# Patient Record
Sex: Female | Born: 1975 | Race: White | Hispanic: No | Marital: Married | Smoking: Never smoker
Health system: Southern US, Community
[De-identification: ages and names within clinical notes are randomized; demographics above are authoritative.]

## PROBLEM LIST (undated history)

## (undated) DIAGNOSIS — E079 Disorder of thyroid, unspecified: Secondary | ICD-10-CM

## (undated) DIAGNOSIS — J45909 Unspecified asthma, uncomplicated: Secondary | ICD-10-CM

## (undated) DIAGNOSIS — K219 Gastro-esophageal reflux disease without esophagitis: Secondary | ICD-10-CM

## (undated) DIAGNOSIS — F319 Bipolar disorder, unspecified: Secondary | ICD-10-CM

## (undated) DIAGNOSIS — G4733 Obstructive sleep apnea (adult) (pediatric): Secondary | ICD-10-CM

## (undated) DIAGNOSIS — K589 Irritable bowel syndrome without diarrhea: Secondary | ICD-10-CM

## (undated) DIAGNOSIS — S134XXA Sprain of ligaments of cervical spine, initial encounter: Secondary | ICD-10-CM

## (undated) HISTORY — PX: CHOLECYSTECTOMY: SHX55

## (undated) HISTORY — PX: TYMPANOSTOMY TUBE PLACEMENT: SHX32

---

## 2014-04-30 ENCOUNTER — Encounter (HOSPITAL_BASED_OUTPATIENT_CLINIC_OR_DEPARTMENT_OTHER): Payer: Self-pay | Admitting: Emergency Medicine

## 2014-04-30 ENCOUNTER — Emergency Department (HOSPITAL_BASED_OUTPATIENT_CLINIC_OR_DEPARTMENT_OTHER)
Admission: EM | Admit: 2014-04-30 | Discharge: 2014-04-30 | Disposition: A | Discharge status: Home / Self Care | Payer: No Typology Code available for payment source | Attending: Emergency Medicine | Admitting: Emergency Medicine

## 2014-04-30 DIAGNOSIS — R002 Palpitations: Secondary | ICD-10-CM

## 2014-04-30 DIAGNOSIS — Z880 Allergy status to penicillin: Secondary | ICD-10-CM | POA: Insufficient documentation

## 2014-04-30 DIAGNOSIS — F411 Generalized anxiety disorder: Secondary | ICD-10-CM | POA: Insufficient documentation

## 2014-04-30 DIAGNOSIS — F419 Anxiety disorder, unspecified: Secondary | ICD-10-CM

## 2014-04-30 DIAGNOSIS — F319 Bipolar disorder, unspecified: Secondary | ICD-10-CM | POA: Insufficient documentation

## 2014-04-30 DIAGNOSIS — R000 Tachycardia, unspecified: Secondary | ICD-10-CM | POA: Insufficient documentation

## 2014-04-30 DIAGNOSIS — K219 Gastro-esophageal reflux disease without esophagitis: Secondary | ICD-10-CM | POA: Insufficient documentation

## 2014-04-30 DIAGNOSIS — Z79899 Other long term (current) drug therapy: Secondary | ICD-10-CM | POA: Insufficient documentation

## 2014-04-30 DIAGNOSIS — E079 Disorder of thyroid, unspecified: Secondary | ICD-10-CM | POA: Insufficient documentation

## 2014-04-30 DIAGNOSIS — R609 Edema, unspecified: Secondary | ICD-10-CM | POA: Insufficient documentation

## 2014-04-30 DIAGNOSIS — E669 Obesity, unspecified: Secondary | ICD-10-CM | POA: Insufficient documentation

## 2014-04-30 DIAGNOSIS — R0789 Other chest pain: Secondary | ICD-10-CM | POA: Insufficient documentation

## 2014-04-30 HISTORY — DX: Gastro-esophageal reflux disease without esophagitis: K21.9

## 2014-04-30 HISTORY — DX: Bipolar disorder, unspecified: F31.9

## 2014-04-30 HISTORY — DX: Disorder of thyroid, unspecified: E07.9

## 2014-04-30 LAB — COMPREHENSIVE METABOLIC PANEL
ALT: 36 U/L — ABNORMAL HIGH (ref 0–35)
AST: 31 U/L (ref 0–37)
Albumin: 3.6 g/dL (ref 3.5–5.2)
Alkaline Phosphatase: 82 U/L (ref 39–117)
BILIRUBIN TOTAL: 0.2 mg/dL — AB (ref 0.3–1.2)
BUN: 8 mg/dL (ref 6–23)
CALCIUM: 9.8 mg/dL (ref 8.4–10.5)
CO2: 25 meq/L (ref 19–32)
CREATININE: 0.6 mg/dL (ref 0.50–1.10)
Chloride: 95 mEq/L — ABNORMAL LOW (ref 96–112)
GLUCOSE: 106 mg/dL — AB (ref 70–99)
Potassium: 3.8 mEq/L (ref 3.7–5.3)
Sodium: 137 mEq/L (ref 137–147)
Total Protein: 7.4 g/dL (ref 6.0–8.3)

## 2014-04-30 LAB — TROPONIN I

## 2014-04-30 NOTE — Discharge Instructions (Signed)
Panic Attacks °Panic attacks are sudden, short-lived surges of severe anxiety, fear, or discomfort. They may occur for no reason when you are relaxed, when you are anxious, or when you are sleeping. Panic attacks may occur for a number of reasons:  °· Healthy people occasionally have panic attacks in extreme, life-threatening situations, such as war or natural disasters. Normal anxiety is a protective mechanism of the body that helps us react to danger (fight or flight response). °· Panic attacks are often seen with anxiety disorders, such as panic disorder, social anxiety disorder, generalized anxiety disorder, and phobias. Anxiety disorders cause excessive or uncontrollable anxiety. They may interfere with your relationships or other life activities. °· Panic attacks are sometimes seen with other mental illnesses such as depression and posttraumatic stress disorder. °· Certain medical conditions, prescription medicines, and drugs of abuse can cause panic attacks. °SYMPTOMS  °Panic attacks start suddenly, peak within 20 minutes, and are accompanied by four or more of the following symptoms: °· Pounding heart or fast heart rate (palpitations). °· Sweating. °· Trembling or shaking. °· Shortness of breath or feeling smothered. °· Feeling choked. °· Chest pain or discomfort. °· Nausea or strange feeling in your stomach. °· Dizziness, lightheadedness, or feeling like you will faint. °· Chills or hot flushes. °· Numbness or tingling in your lips or hands and feet. °· Feeling that things are not real or feeling that you are not yourself. °· Fear of losing control or going crazy. °· Fear of dying. °Some of these symptoms can mimic serious medical conditions. For example, you may think you are having a heart attack. Although panic attacks can be very scary, they are not life threatening. °DIAGNOSIS  °Panic attacks are diagnosed through an assessment by your health care provider. Your health care provider will ask questions  about your symptoms, such as where and when they occurred. Your health care provider will also ask about your medical history and use of alcohol and drugs, including prescription medicines. Your health care provider may order blood tests or other studies to rule out a serious medical condition. Your health care provider may refer you to a mental health professional for further evaluation. °TREATMENT  °· Most healthy people who have one or two panic attacks in an extreme, life-threatening situation will not require treatment. °· The treatment for panic attacks associated with anxiety disorders or other mental illness typically involves counseling with a mental health professional, medicine, or a combination of both. Your health care provider will help determine what treatment is best for you. °· Panic attacks due to physical illness usually goes away with treatment of the illness. If prescription medicine is causing panic attacks, talk with your health care provider about stopping the medicine, decreasing the dose, or substituting another medicine. °· Panic attacks due to alcohol or drug abuse goes away with abstinence. Some adults need professional help in order to stop drinking or using drugs. °HOME CARE INSTRUCTIONS  °· Take all your medicines as prescribed.   °· Check with your health care provider before starting new prescription or over-the-counter medicines. °· Keep all follow up appointments with your health care provider. °SEEK MEDICAL CARE IF: °· You are not able to take your medicines as prescribed. °· Your symptoms do not improve or get worse. °SEEK IMMEDIATE MEDICAL CARE IF:  °· You experience panic attack symptoms that are different than your usual symptoms. °· You have serious thoughts about hurting yourself or others. °· You are taking medicine for panic attacks and   have a serious side effect. MAKE SURE YOU:  Understand these instructions.  Will watch your condition.  Will get help right away  if you are not doing well or get worse. Document Released: 11/16/2005 Document Revised: 09/06/2013 Document Reviewed: 06/30/2013 [facility] Patient Information 2014 [place], [place].  Palpitations  A palpitation is the feeling that your heartbeat is irregular. It may feel like your heart is fluttering or skipping a beat. It may also feel like your heart is beating faster than normal. This is usually not a serious problem. In some cases, you may need more medical tests. HOME CARE  Avoid:  Caffeine in coffee, tea, soft drinks, diet pills, and energy drinks.  Chocolate.  Alcohol.  Stop smoking if you smoke.  Reduce your stress and anxiety. Try:  A method that measures bodily functions so you can learn to control them (biofeedback).  Yoga.  Meditation.  Physical activity such as swimming, jogging, or walking.  Get plenty of rest and sleep. GET HELP RIGHT AWAY IF:   You have chest pain.  You feel short of breath.  You have a very bad headache.  You feel dizzy or pass out (faint).  Your fast or irregular heartbeat continues after 24 hours.  Your palpitations occur more often. MAKE SURE YOU:   Understand these instructions.  Will watch your condition.  Will get help right away if you are not doing well or get worse. Document Released: 08/25/2008 Document Revised: 05/17/2012 Document Reviewed: 01/15/2012 [facility] Patient Information 2014 [place], [place].

## 2014-04-30 NOTE — ED Notes (Signed)
Pt c/o palpations and SOB x 1 hr ago also now c/o chest pressure

## 2014-04-30 NOTE — ED Provider Notes (Addendum)
CSN: [id number]     Arrival date & time 04/30/14  1951 History  This chart was scribed for Rolland PorterMark Bergen Magner, MD by Charline BillsEssence Howell, ED Scribe. The patient was seen in room MH02/MH02. Patient's care was started at 10:39 PM.  Chief Complaint  Patient presents with  . Palpitations   The history is provided by the patient. No language interpreter was used.   HPI Comments: Jessica Nicholson is a 38 y.o. female, with a rapid heartbeat, who presents to the Emergency Department complaining of palpitations onset 7 PM today. Pt states that she was riding in the car when she noted SOB. Pt reports that it feels like her heart is racing during triage. She also reports associated mild HA, chest pressure and previous bilateral ankle swelling. Pt reports 2 episodes of musculoskeletal chest pain before but states that this feels different. Pt states that she took lasix yesterday and 2 today with potassium pill. Pt takes Synthroid and is supposed to see endocrinologist tomorrow.   Past Medical History  Diagnosis Date  . GERD (gastroesophageal reflux disease)   . Thyroid disease   . Insomnia   . Bipolar 1 disorder    History reviewed. No pertinent past surgical history. History reviewed. No pertinent family history. History  Substance Use Topics  . Smoking status: Never Smoker   . Smokeless tobacco: Not on file  . Alcohol Use: No   OB History   Grav Para Term Preterm Abortions TAB SAB Ect Mult Living                 Review of Systems  Constitutional: Negative for fever, chills, appetite change and fatigue.  HENT: Negative for mouth sores, sore throat and trouble swallowing.   Eyes: Negative for visual disturbance.  Respiratory: Positive for chest tightness (pressure). Negative for wheezing.   Cardiovascular: Positive for palpitations and leg swelling. Negative for chest pain.  Gastrointestinal: Negative for abdominal pain, diarrhea and abdominal distention.  Endocrine: Negative for polydipsia, polyphagia and  polyuria.  Genitourinary: Negative for dysuria, frequency and hematuria.  Musculoskeletal: Negative for gait problem.  Skin: Negative for color change, pallor and rash.  Neurological: Positive for headaches (resolved). Negative for syncope and light-headedness.  Hematological: Does not bruise/bleed easily.  Psychiatric/Behavioral: Negative for behavioral problems and confusion.  All other systems reviewed and are negative.  Allergies  Amoxapine and related; Penicillins; Topamax; and Toradol  Home Medications   Prior to Admission medications   Medication Sig Start Date End Date Taking? Authorizing Provider  clidinium-chlordiazePOXIDE (LIBRAX) 5-2.5 MG per capsule Take 1 capsule by mouth.   Yes Historical Provider, MD  clonazePAM (KLONOPIN) 0.5 MG tablet Take 0.5 mg by mouth 3 (three) times daily.   Yes Historical Provider, MD  divalproex (DEPAKOTE) 500 MG DR tablet Take 500 mg by mouth 3 (three) times daily.   Yes Historical Provider, MD  eszopiclone (LUNESTA) 2 MG TABS tablet Take 3 mg by mouth at bedtime as needed for sleep. Take immediately before bedtime   Yes Historical Provider, MD  furosemide (LASIX) 20 MG tablet Take 20 mg by mouth.   Yes Historical Provider, MD  haloperidol (HALDOL) 5 MG tablet Take 5 mg by mouth 2 (two) times daily.   Yes Historical Provider, MD  levothyroxine (SYNTHROID, LEVOTHROID) 75 MCG tablet Take 75 mcg by mouth daily before breakfast.   Yes Historical Provider, MD  lithium 300 MG tablet Take 450 mg by mouth 3 (three) times daily.   Yes Historical Provider, MD  loratadine (  CLARITIN) 10 MG tablet Take 10 mg by mouth daily.   Yes Historical Provider, MD  Milnacipran (SAVELLA) 50 MG TABS tablet Take 50 mg by mouth 2 (two) times daily.   Yes Historical Provider, MD  montelukast (SINGULAIR) 10 MG tablet Take 10 mg by mouth at bedtime.   Yes Historical Provider, MD  pindolol (VISKEN) 5 MG tablet Take 5 mg by mouth 2 (two) times daily.   Yes Historical Provider, MD   Potassium Chloride CR (MICRO-K) 8 MEQ CPCR capsule CR Take 8 mEq by mouth.   Yes Historical Provider, MD  primidone (MYSOLINE) 50 MG tablet Take by mouth 4 (four) times daily.   Yes Historical Provider, MD  ramelteon (ROZEREM) 8 MG tablet Take 8 mg by mouth at bedtime.   Yes Historical Provider, MD  ranitidine (ZANTAC) 300 MG tablet Take 300 mg by mouth at bedtime.   Yes Historical Provider, MD  risperidone (RISPERDAL) 4 MG tablet Take 4 mg by mouth 2 (two) times daily.   Yes Historical Provider, MD  temazepam (RESTORIL) 30 MG capsule Take 45 mg by mouth at bedtime as needed for sleep.   Yes Historical Provider, MD   Triage Vitals: BP 122/92  Pulse 96  Temp(Src) 98.8 F (37.1 C) (Oral)  Resp 16  Ht 5\' 2"  (1.575 m)  Wt 273 lb (123.832 kg)  BMI 49.92 kg/m2  SpO2 95% Physical Exam  Constitutional: She is oriented to person, place, and time. She appears well-developed and well-nourished. No distress.  Obese, appears slightly anxious  HENT:  Head: Normocephalic.  Eyes: Conjunctivae and EOM are normal. Pupils are equal, round, and reactive to light. No scleral icterus.  Neck: Normal range of motion. Neck supple. No thyromegaly present.  Cardiovascular: Normal rate and regular rhythm.  Exam reveals no gallop and no friction rub.   No murmur heard. Pulmonary/Chest: Effort normal and breath sounds normal. No respiratory distress. She has no wheezes. She has no rales.  Abdominal: Soft. Bowel sounds are normal. She exhibits no distension. There is no tenderness. There is no rebound.  Musculoskeletal: Normal range of motion. She exhibits edema.  Neurological: She is alert and oriented to person, place, and time.  Skin: Skin is warm and dry. No rash noted.  2+ symmetric edema in lower extremity  Psychiatric: She has a normal mood and affect. Her behavior is normal.   ED Course  Procedures (including critical care time) DIAGNOSTIC STUDIES: Oxygen Saturation is 95% on RA, adequate by my  interpretation.    COORDINATION OF CARE: 10:52 PM Discussed treatment plan with pt at bedside and pt agreed to plan.  Labs Review Labs Reviewed  COMPREHENSIVE METABOLIC PANEL - Abnormal; Notable for the following:    Chloride 95 (*)    Glucose, Bld 106 (*)    ALT 36 (*)    Total Bilirubin 0.2 (*)    All other components within normal limits  TROPONIN I   Imaging Review No results found.   EKG Interpretation None      MDM   Final diagnoses:  Anxiety  Palpitations    Normal exam. Not tachycardic here. Was still feeling "like my heart was racing" at triage. Heart rate of 90. She takes pindolol for chronic tachycardia. I think this is an episode of anxiety. Not concerned that this was tachyarrhythmia, or PE. Has normal exam is asymptomatic here.  I personally performed the services described in this documentation, which was scribed in my presence. The recorded information has been reviewed  and is accurate.   Rolland Porter, MD 04/30/14 [id number]  Rolland Porter, MD 04/30/14 [phone]

## 2014-05-02 ENCOUNTER — Encounter (HOSPITAL_BASED_OUTPATIENT_CLINIC_OR_DEPARTMENT_OTHER): Payer: Self-pay | Admitting: Emergency Medicine

## 2014-05-02 ENCOUNTER — Emergency Department (HOSPITAL_BASED_OUTPATIENT_CLINIC_OR_DEPARTMENT_OTHER)
Admission: EM | Admit: 2014-05-02 | Discharge: 2014-05-03 | Disposition: A | Discharge status: Home / Self Care | Payer: No Typology Code available for payment source | Attending: Emergency Medicine | Admitting: Emergency Medicine

## 2014-05-02 DIAGNOSIS — F319 Bipolar disorder, unspecified: Secondary | ICD-10-CM | POA: Insufficient documentation

## 2014-05-02 DIAGNOSIS — R600 Localized edema: Secondary | ICD-10-CM

## 2014-05-02 DIAGNOSIS — K219 Gastro-esophageal reflux disease without esophagitis: Secondary | ICD-10-CM | POA: Insufficient documentation

## 2014-05-02 DIAGNOSIS — E079 Disorder of thyroid, unspecified: Secondary | ICD-10-CM | POA: Insufficient documentation

## 2014-05-02 DIAGNOSIS — Z79899 Other long term (current) drug therapy: Secondary | ICD-10-CM | POA: Insufficient documentation

## 2014-05-02 DIAGNOSIS — R609 Edema, unspecified: Secondary | ICD-10-CM | POA: Insufficient documentation

## 2014-05-02 HISTORY — DX: Irritable bowel syndrome, unspecified: K58.9

## 2014-05-02 HISTORY — DX: Morbid (severe) obesity due to excess calories: E66.01

## 2014-05-02 NOTE — ED Notes (Signed)
Pt having increased pain in her legs and feet this evening. Was seen by pmd today for the lower extremity swelling and she was told to take two of her Laxis 20 mg instead of 1/day.  Pt ate fast food [place] fries and 2 large sodas this evening and the swelling and pain are both worse now. Her pmd won't speak to her tonight and told her to come to the Ed for eval.

## 2014-05-03 LAB — BASIC METABOLIC PANEL
BUN: 7 mg/dL (ref 6–23)
CO2: 25 mEq/L (ref 19–32)
Calcium: 9.6 mg/dL (ref 8.4–10.5)
Chloride: 98 mEq/L (ref 96–112)
Creatinine, Ser: 0.6 mg/dL (ref 0.50–1.10)
Glucose, Bld: 109 mg/dL — ABNORMAL HIGH (ref 70–99)
POTASSIUM: 3.8 meq/L (ref 3.7–5.3)
SODIUM: 138 meq/L (ref 137–147)

## 2014-05-03 MED ORDER — FUROSEMIDE 10 MG/ML IJ SOLN
20.0000 mg | Freq: Once | INTRAMUSCULAR | Status: AC
  Administered 2014-05-03: 20 mg via INTRAVENOUS
  Filled 2014-05-03: qty 2

## 2014-05-03 MED ORDER — GABAPENTIN 300 MG PO CAPS
300.0000 mg | ORAL_CAPSULE | Freq: Once | ORAL | Status: DC
  Filled 2014-05-03: qty 1

## 2014-05-03 MED ORDER — FUROSEMIDE 20 MG PO TABS: 40.0000 mg | ORAL_TABLET | Freq: Every day | ORAL | Status: AC

## 2014-05-03 NOTE — ED Notes (Signed)
MD at bedside discussing test results and dispo plan of care. 

## 2014-05-03 NOTE — Discharge Instructions (Signed)
Peripheral Edema You have swelling in your legs (peripheral edema). This swelling is due to excess accumulation of salt and water in your body. Edema may be a sign of heart, kidney or liver disease, or a side effect of a medication. It may also be due to problems in the leg veins. Elevating your legs and using special support stockings may be very helpful, if the cause of the swelling is due to poor venous circulation. Avoid long periods of standing, whatever the cause. Treatment of edema depends on identifying the cause. Chips, pretzels, pickles and other salty foods should be avoided. Restricting salt in your diet is almost always needed. Water pills (diuretics) are often used to remove the excess salt and water from your body via urine. These medicines prevent the kidney from reabsorbing sodium. This increases urine flow. Diuretic treatment may also result in lowering of potassium levels in your body. Potassium supplements may be needed if you have to use diuretics daily. Daily weights can help you keep track of your progress in clearing your edema. You should call your caregiver for follow up care as recommended. SEEK IMMEDIATE MEDICAL CARE IF:   You have increased swelling, pain, redness, or heat in your legs.  You develop shortness of breath, especially when lying down.  You develop chest or abdominal pain, weakness, or fainting.  You have a fever. Document Released: 12/24/2004 Document Revised: 02/08/2012 Document Reviewed: 12/04/2009 [employer] Patient Information 2014 [employer].  Sodium-Controlled Diet Sodium is a mineral. It is found in many foods. Sodium may be found naturally or added during the making of a food. The most common form of sodium is salt, which is made up of sodium and chloride. Reducing your sodium intake involves changing your eating habits. The following guidelines will help you reduce the sodium in your diet:  Stop using the salt shaker.  Use salt sparingly in  cooking and baking.  Substitute with sodium-free seasonings and spices.  Do not use a salt substitute (potassium chloride) without your caregiver's permission.  Include a variety of fresh, unprocessed foods in your diet.  Limit the use of processed and convenience foods that are high in sodium. USE THE FOLLOWING FOODS SPARINGLY: Breads/Starches  Commercial bread stuffing, commercial pancake or waffle mixes, coating mixes. Waffles. Croutons. Prepared (boxed or frozen) potato, rice, or noodle mixes that contain salt or sodium. Salted French fries or hash browns. Salted popcorn, breads, crackers, chips, or snack foods. Vegetables  Vegetables canned with salt or prepared in cream, butter, or cheese sauces. Sauerkraut. Tomato or vegetable juices canned with salt.  Fresh vegetables are allowed if rinsed thoroughly. Fruit  Fruit is okay to eat. Meat and Meat Substitutes  Salted or smoked meats, such as bacon or Canadian bacon, chipped or corned beef, hot dogs, salt pork, luncheon meats, pastrami, ham, or sausage. Canned or smoked fish, poultry, or meat. Processed cheese or cheese spreads, blue or Roquefort cheese. Battered or frozen fish products. Prepared spaghetti sauce. Baked beans. Reuben sandwiches. Salted nuts. Caviar. Milk  Limit buttermilk to 1 cup per week. Soups and Combination Foods  Bouillon cubes, canned or dried soups, broth, consomm. Convenience (frozen or packaged) dinners with more than 600 mg sodium. Pot pies, pizza, Asian food, fast food cheeseburgers, and specialty sandwiches. Desserts and Sweets  Regular (salted) desserts, pie, commercial fruit snack pies, commercial snack cakes, canned puddings.  Eat desserts and sweets in moderation. Fats and Oils  Gravy mixes or canned gravy. No more than 1 to 2   tbs of salad dressing. Chip dips.  Eat fats and oils in moderation. Beverages  See those listed under the vegetables and milk groups. Condiments  Ketchup,  mustard, meat sauces, salsa, regular (salted) and lite soy sauce or mustard. Dill pickles, olives, meat tenderizer. Prepared horseradish or pickle relish. Dutch-processed cocoa. Baking powder or baking soda used medicinally. Worcestershire sauce. "Light" salt. Salt substitute, unless approved by your caregiver. Document Released: 05/08/2002 Document Revised: 02/08/2012 Document Reviewed: 12/09/2009 [employer] Patient Information 2014 [employer].  

## 2014-05-03 NOTE — ED Provider Notes (Signed)
CSN: [id number]     Arrival date & time 05/02/14  2323 History   First MD Initiated Contact with Patient 05/03/14 0006     Chief Complaint  Patient presents with  . Foot Pain     (Consider location/radiation/quality/duration/timing/severity/associated sxs/prior Treatment) HPI  Patient presents with bilateral lower committee swelling and foot pain. Patient reports recent history over the last month of increasing symmetric lower any swelling. She states that today it got worse specifically after she ate fries and soda from a local fast food restaurant. She's currently on 20 mg of Lasix daily by her primary care physician but was told to take an additional dose.  Patient states that both her legs are painful when she walks.  Rates her pain right now at 8/10. She reports that it feels like "pins and needles." She called her primary care physician who told her to be evaluated in the ER. She denies any shortness or breath or chest pain.  Patient endorses high salty diet. Patient took the tramadol at home prior to arrival which "didn't help much."  Past Medical History  Diagnosis Date  . GERD (gastroesophageal reflux disease)   . Thyroid disease   . Insomnia   . Bipolar 1 disorder   . Morbid obesity   . IBS (irritable bowel syndrome)    Past Surgical History  Procedure Laterality Date  . Cholecystectomy    . Tympanostomy tube placement     No family history on file. History  Substance Use Topics  . Smoking status: Never Smoker   . Smokeless tobacco: Not on file  . Alcohol Use: No   OB History   Grav Para Term Preterm Abortions TAB SAB Ect Mult Living                 Review of Systems  Constitutional: Negative for fever.  Respiratory: Negative for chest tightness and shortness of breath.   Cardiovascular: Positive for leg swelling. Negative for chest pain and palpitations.  Gastrointestinal: Negative for abdominal pain.  Genitourinary: Negative for dysuria.  Musculoskeletal:  Positive for back pain.  Skin: Negative for color change and rash.  All other systems reviewed and are negative.     Allergies  Amoxapine and related; Aspartame and phenylalanine; Neurontin; Penicillins; Topamax; and Toradol  Home Medications   Prior to Admission medications   Medication Sig Start Date End Date Taking? Authorizing Provider  clidinium-chlordiazePOXIDE (LIBRAX) 5-2.5 MG per capsule Take 1 capsule by mouth.    Historical Provider, MD  clonazePAM (KLONOPIN) 0.5 MG tablet Take 0.5 mg by mouth 3 (three) times daily.    Historical Provider, MD  divalproex (DEPAKOTE) 500 MG DR tablet Take 500 mg by mouth 3 (three) times daily.    Historical Provider, MD  eszopiclone (LUNESTA) 2 MG TABS tablet Take 3 mg by mouth at bedtime as needed for sleep. Take immediately before bedtime    Historical Provider, MD  furosemide (LASIX) 20 MG tablet Take 2 tablets (40 mg total) by mouth daily. 05/03/14   Shon Batonourtney F Harrel Ferrone, MD  haloperidol (HALDOL) 5 MG tablet Take 5 mg by mouth 2 (two) times daily.    Historical Provider, MD  levothyroxine (SYNTHROID, LEVOTHROID) 75 MCG tablet Take 88 mcg by mouth daily before breakfast.     Historical Provider, MD  lithium 300 MG tablet Take 450 mg by mouth 3 (three) times daily.    Historical Provider, MD  loratadine (CLARITIN) 10 MG tablet Take 10 mg by mouth daily.  Historical Provider, MD  Milnacipran (SAVELLA) 50 MG TABS tablet Take 50 mg by mouth 2 (two) times daily.    Historical Provider, MD  montelukast (SINGULAIR) 10 MG tablet Take 10 mg by mouth at bedtime.    Historical Provider, MD  pindolol (VISKEN) 5 MG tablet Take 5 mg by mouth 2 (two) times daily.    Historical Provider, MD  Potassium Chloride CR (MICRO-K) 8 MEQ CPCR capsule CR Take 8 mEq by mouth.    Historical Provider, MD  primidone (MYSOLINE) 50 MG tablet Take by mouth 4 (four) times daily.    Historical Provider, MD  ramelteon (ROZEREM) 8 MG tablet Take 8 mg by mouth at bedtime.     Historical Provider, MD  ranitidine (ZANTAC) 300 MG tablet Take 300 mg by mouth at bedtime.    Historical Provider, MD  risperidone (RISPERDAL) 4 MG tablet Take 4 mg by mouth 2 (two) times daily.    Historical Provider, MD  temazepam (RESTORIL) 30 MG capsule Take 45 mg by mouth at bedtime as needed for sleep.    Historical Provider, MD   BP 142/90  Pulse 96  Temp(Src) 98.3 F (36.8 C) (Oral)  Resp 16  Ht 5\' 2"  (1.575 m)  Wt 273 lb (123.832 kg)  BMI 49.92 kg/m2  SpO2 97% Physical Exam  Nursing note and vitals reviewed. Constitutional: She is oriented to person, place, and time. She appears well-developed and well-nourished. No distress.  Obese  HENT:  Head: Normocephalic and atraumatic.  Cardiovascular: Normal rate, regular rhythm and normal heart sounds.   No murmur heard. Pulmonary/Chest: Effort normal and breath sounds normal. No respiratory distress. She has no wheezes.  Abdominal: Soft. There is no tenderness.  Musculoskeletal: She exhibits edema.  2+ lower extremity edema, symmetric, no overlying skin changes, no tenderness to palpation to the bilateral calves  Neurological: She is alert and oriented to person, place, and time.  Skin: Skin is warm and dry.  Psychiatric: She has a normal mood and affect.    ED Course  Procedures (including critical care time) Labs Review Labs Reviewed  BASIC METABOLIC PANEL - Abnormal; Notable for the following:    Glucose, Bld 109 (*)    All other components within normal limits    Imaging Review No results found.   EKG Interpretation None      MDM   Final diagnoses:  Bilateral lower extremity edema    Patient presents with bilateral lower extremity edema. This is been ongoing problem for the patient and she is noncompliant with a low sodium diet. Patient reports acute worsening today. She is nontoxic on exam. She is without chest pain or shortness of breath. Suspect patient's lower extremity swelling has to do with  noncompliance with diet and dependent edema. Low suspicion at this time for DVT given exam. BMP shows normal renal function and potassium. Glucose mildly elevated at 106 and also have a suspicion the patient may have an element of neuropathy given description of pain. She however does not tolerate gabapentin. Patient was given one dose of 20 mg IV Lasix in the emergency room. I instructed the patient to increase her daily dose to 40 mg and followup with her primary physician. Patient stated understanding.  After history, exam, and medical workup I feel the patient has been appropriately medically screened and is safe for discharge home. Pertinent diagnoses were discussed with the patient. Patient was given return precautions.     Shon Baton, MD 05/03/14 [phone]

## 2014-05-10 ENCOUNTER — Encounter (HOSPITAL_BASED_OUTPATIENT_CLINIC_OR_DEPARTMENT_OTHER): Payer: Self-pay | Admitting: Emergency Medicine

## 2014-05-10 ENCOUNTER — Emergency Department (HOSPITAL_BASED_OUTPATIENT_CLINIC_OR_DEPARTMENT_OTHER)
Admission: EM | Admit: 2014-05-10 | Discharge: 2014-05-11 | Disposition: A | Discharge status: Home / Self Care | Payer: No Typology Code available for payment source | Attending: Emergency Medicine | Admitting: Emergency Medicine

## 2014-05-10 DIAGNOSIS — K219 Gastro-esophageal reflux disease without esophagitis: Secondary | ICD-10-CM | POA: Insufficient documentation

## 2014-05-10 DIAGNOSIS — G4700 Insomnia, unspecified: Secondary | ICD-10-CM | POA: Insufficient documentation

## 2014-05-10 DIAGNOSIS — Z79899 Other long term (current) drug therapy: Secondary | ICD-10-CM | POA: Insufficient documentation

## 2014-05-10 DIAGNOSIS — Z880 Allergy status to penicillin: Secondary | ICD-10-CM | POA: Insufficient documentation

## 2014-05-10 DIAGNOSIS — E079 Disorder of thyroid, unspecified: Secondary | ICD-10-CM | POA: Insufficient documentation

## 2014-05-10 DIAGNOSIS — M542 Cervicalgia: Secondary | ICD-10-CM | POA: Insufficient documentation

## 2014-05-10 DIAGNOSIS — G8929 Other chronic pain: Secondary | ICD-10-CM | POA: Insufficient documentation

## 2014-05-10 HISTORY — DX: Sprain of ligaments of cervical spine, initial encounter: S13.4XXA
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
## 2014-05-11 ENCOUNTER — Encounter (HOSPITAL_BASED_OUTPATIENT_CLINIC_OR_DEPARTMENT_OTHER): Payer: Self-pay | Admitting: Emergency Medicine

## 2014-05-11 ENCOUNTER — Emergency Department (HOSPITAL_BASED_OUTPATIENT_CLINIC_OR_DEPARTMENT_OTHER)
Admission: EM | Admit: 2014-05-11 | Discharge: 2014-05-11 | Disposition: A | Discharge status: Home / Self Care | Payer: No Typology Code available for payment source | Attending: Emergency Medicine | Admitting: Emergency Medicine

## 2014-05-11 DIAGNOSIS — G8929 Other chronic pain: Secondary | ICD-10-CM | POA: Insufficient documentation

## 2014-05-11 DIAGNOSIS — R52 Pain, unspecified: Secondary | ICD-10-CM | POA: Insufficient documentation

## 2014-05-11 DIAGNOSIS — T40605A Adverse effect of unspecified narcotics, initial encounter: Secondary | ICD-10-CM | POA: Insufficient documentation

## 2014-05-11 DIAGNOSIS — M542 Cervicalgia: Secondary | ICD-10-CM | POA: Insufficient documentation

## 2014-05-11 DIAGNOSIS — T50905A Adverse effect of unspecified drugs, medicaments and biological substances, initial encounter: Secondary | ICD-10-CM

## 2014-05-11 DIAGNOSIS — F319 Bipolar disorder, unspecified: Secondary | ICD-10-CM | POA: Insufficient documentation

## 2014-05-11 DIAGNOSIS — Z87828 Personal history of other (healed) physical injury and trauma: Secondary | ICD-10-CM | POA: Insufficient documentation

## 2014-05-11 DIAGNOSIS — E079 Disorder of thyroid, unspecified: Secondary | ICD-10-CM | POA: Insufficient documentation

## 2014-05-11 DIAGNOSIS — L299 Pruritus, unspecified: Secondary | ICD-10-CM | POA: Insufficient documentation

## 2014-05-11 DIAGNOSIS — K219 Gastro-esophageal reflux disease without esophagitis: Secondary | ICD-10-CM | POA: Insufficient documentation

## 2014-05-11 DIAGNOSIS — Z79899 Other long term (current) drug therapy: Secondary | ICD-10-CM | POA: Insufficient documentation

## 2014-05-11 DIAGNOSIS — Z880 Allergy status to penicillin: Secondary | ICD-10-CM | POA: Insufficient documentation

## 2014-05-11 MED ORDER — HYDROXYZINE HCL 25 MG PO TABS: 25.0000 mg | ORAL_TABLET | ORAL | Status: AC | PRN

## 2014-05-11 MED ORDER — HYDROXYZINE HCL 25 MG PO TABS
25.0000 mg | ORAL_TABLET | Freq: Once | ORAL | Status: AC
  Administered 2014-05-11: 25 mg via ORAL
  Filled 2014-05-11: qty 1

## 2014-05-11 MED ORDER — OXYCODONE-ACETAMINOPHEN 5-325 MG PO TABS: 1.0000 | ORAL_TABLET | Freq: Four times a day (QID) | ORAL | Status: DC | PRN

## 2014-05-11 MED ORDER — HYDROMORPHONE HCL PF 2 MG/ML IJ SOLN
2.0000 mg | Freq: Once | INTRAMUSCULAR | Status: AC
  Administered 2014-05-11: 2 mg via INTRAMUSCULAR
  Filled 2014-05-11: qty 1

## 2014-05-11 MED ORDER — ONDANSETRON 4 MG PO TBDP
4.0000 mg | ORAL_TABLET | Freq: Once | ORAL | Status: AC
  Administered 2014-05-11: 4 mg via ORAL
  Filled 2014-05-11: qty 1
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
## 2014-05-11 NOTE — ED Notes (Signed)
Pt states itching from meds

## 2014-05-11 NOTE — ED Notes (Signed)
Pt states is itching, lip numbness and hot after receiving dilaudid im at 0130 this am,  Sx started 2 hours after meds given No hives noted no distress
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
## 2014-05-17 ENCOUNTER — Emergency Department (HOSPITAL_BASED_OUTPATIENT_CLINIC_OR_DEPARTMENT_OTHER)
Admission: EM | Admit: 2014-05-17 | Discharge: 2014-05-18 | Disposition: A | Discharge status: Home / Self Care | Payer: No Typology Code available for payment source | Attending: Emergency Medicine | Admitting: Emergency Medicine

## 2014-05-17 ENCOUNTER — Encounter (HOSPITAL_BASED_OUTPATIENT_CLINIC_OR_DEPARTMENT_OTHER): Payer: Self-pay | Admitting: Emergency Medicine

## 2014-05-17 DIAGNOSIS — S161XXS Strain of muscle, fascia and tendon at neck level, sequela: Secondary | ICD-10-CM

## 2014-05-17 DIAGNOSIS — Z880 Allergy status to penicillin: Secondary | ICD-10-CM | POA: Insufficient documentation

## 2014-05-17 DIAGNOSIS — Y9389 Activity, other specified: Secondary | ICD-10-CM | POA: Insufficient documentation

## 2014-05-17 DIAGNOSIS — F319 Bipolar disorder, unspecified: Secondary | ICD-10-CM | POA: Insufficient documentation

## 2014-05-17 DIAGNOSIS — IMO0002 Reserved for concepts with insufficient information to code with codable children: Secondary | ICD-10-CM | POA: Insufficient documentation

## 2014-05-17 DIAGNOSIS — K219 Gastro-esophageal reflux disease without esophagitis: Secondary | ICD-10-CM | POA: Insufficient documentation

## 2014-05-17 DIAGNOSIS — Z79899 Other long term (current) drug therapy: Secondary | ICD-10-CM | POA: Insufficient documentation

## 2014-05-17 DIAGNOSIS — R000 Tachycardia, unspecified: Secondary | ICD-10-CM | POA: Insufficient documentation

## 2014-05-17 DIAGNOSIS — E079 Disorder of thyroid, unspecified: Secondary | ICD-10-CM | POA: Insufficient documentation

## 2014-05-17 DIAGNOSIS — Y929 Unspecified place or not applicable: Secondary | ICD-10-CM | POA: Insufficient documentation

## 2014-05-17 DIAGNOSIS — S0990XA Unspecified injury of head, initial encounter: Secondary | ICD-10-CM | POA: Insufficient documentation

## 2014-05-17 DIAGNOSIS — X58XXXA Exposure to other specified factors, initial encounter: Secondary | ICD-10-CM | POA: Insufficient documentation

## 2014-05-17 NOTE — ED Notes (Signed)
Pt. Reports she has a migraine headache and stiff neck in the back of her neck.  Pt. Reports she took percocet tonight for pain at 2030 with no relief.  Pt. Alert and oriented with dry mouth and cotton tongue.  Pt. Reports she drove herself to ED and she can get a ride home if we give her some Morphine.

## 2014-05-18 MED ORDER — IBUPROFEN 400 MG PO TABS
600.0000 mg | ORAL_TABLET | Freq: Once | ORAL | Status: AC
  Administered 2014-05-18: 600 mg via ORAL
  Filled 2014-05-18 (×2): qty 1

## 2014-05-18 MED ORDER — ORPHENADRINE CITRATE ER 100 MG PO TB12: 100.0000 mg | ORAL_TABLET | Freq: Two times a day (BID) | ORAL | Status: DC
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
## 2014-05-18 NOTE — ED Notes (Signed)
MD at bedside. 

## 2014-05-18 NOTE — Discharge Instructions (Signed)
If you were given medicines take as directed.  If you are on coumadin or contraceptives realize their levels and effectiveness is altered by many different medicines.  If you have any reaction (rash, tongues swelling, other) to the medicines stop taking and see a physician.   Please follow up as directed and return to the ER or see a physician for new or worsening symptoms.  Thank you. Filed Vitals:   05/17/14 2238  BP: 119/75  Pulse: 101  Temp: 98.4 F (36.9 C)  TempSrc: Oral  Resp: 18  Height: 5\' 2"  (1.575 m)  Weight: 273 lb (123.832 kg)  SpO2: 95%

## 2014-05-20 ENCOUNTER — Emergency Department (HOSPITAL_BASED_OUTPATIENT_CLINIC_OR_DEPARTMENT_OTHER)
Admission: EM | Admit: 2014-05-20 | Discharge: 2014-05-20 | Disposition: A | Discharge status: Home / Self Care | Payer: No Typology Code available for payment source | Attending: Emergency Medicine | Admitting: Emergency Medicine

## 2014-05-20 ENCOUNTER — Encounter (HOSPITAL_BASED_OUTPATIENT_CLINIC_OR_DEPARTMENT_OTHER): Payer: Self-pay | Admitting: Emergency Medicine

## 2014-05-20 DIAGNOSIS — G4700 Insomnia, unspecified: Secondary | ICD-10-CM | POA: Insufficient documentation

## 2014-05-20 DIAGNOSIS — Z880 Allergy status to penicillin: Secondary | ICD-10-CM | POA: Insufficient documentation

## 2014-05-20 DIAGNOSIS — Z79899 Other long term (current) drug therapy: Secondary | ICD-10-CM | POA: Insufficient documentation

## 2014-05-20 DIAGNOSIS — F319 Bipolar disorder, unspecified: Secondary | ICD-10-CM | POA: Insufficient documentation

## 2014-05-20 DIAGNOSIS — IMO0001 Reserved for inherently not codable concepts without codable children: Secondary | ICD-10-CM | POA: Insufficient documentation

## 2014-05-20 DIAGNOSIS — Z87828 Personal history of other (healed) physical injury and trauma: Secondary | ICD-10-CM | POA: Insufficient documentation

## 2014-05-20 DIAGNOSIS — E079 Disorder of thyroid, unspecified: Secondary | ICD-10-CM | POA: Insufficient documentation

## 2014-05-20 DIAGNOSIS — K589 Irritable bowel syndrome without diarrhea: Secondary | ICD-10-CM | POA: Insufficient documentation

## 2014-05-20 DIAGNOSIS — K219 Gastro-esophageal reflux disease without esophagitis: Secondary | ICD-10-CM | POA: Insufficient documentation

## 2014-05-20 DIAGNOSIS — M797 Fibromyalgia: Secondary | ICD-10-CM

## 2014-05-20 NOTE — ED Notes (Signed)
Belongings at bedside

## 2014-05-20 NOTE — ED Notes (Signed)
C/o bilateral leg pain that started around 2300 tonight. States she took tramadol at home without relief. States she also took "2 Advil" without relief. Describes pain as constant and sharp. States she has a hx of fibromyalgia. Denies any injury but states she "walked" a lot today while at the mall. Denies any recent long car ride or travel. Denies any sob.

## 2014-05-20 NOTE — ED Provider Notes (Signed)
CSN: [id number]     Arrival date & time 05/20/14  0201 History   First MD Initiated Contact with Patient 05/20/14 [phone]     Chief Complaint  Patient presents with  . Leg Pain     (Consider location/radiation/quality/duration/timing/severity/associated sxs/prior Treatment) HPI This is a 38 year old female with a history of fibromyalgia. She states she did a lot of walking in the mall yesterday. She has subsequently developed moderate to severe pain in the muscles of her legs bilaterally, consistent with an exacerbation of her fibromyalgia. She denies specific injury. She denies dark urine. She took ibuprofen and Ultram without relief of her pain. She does have Percocet at home but was afraid to mix the Percocet with the Ultram. Pain is worse with ambulation or palpation. There is no swelling or discoloration.  Past Medical History  Diagnosis Date  . GERD (gastroesophageal reflux disease)   . Thyroid disease   . Insomnia   . Bipolar 1 disorder   . Morbid obesity   . IBS (irritable bowel syndrome)   . Whiplash    Past Surgical History  Procedure Laterality Date  . Cholecystectomy    . Tympanostomy tube placement     No family history on file. History  Substance Use Topics  . Smoking status: Never Smoker   . Smokeless tobacco: Not on file  . Alcohol Use: No   OB History   Grav Para Term Preterm Abortions TAB SAB Ect Mult Living                 Review of Systems  All other systems reviewed and are negative.   Allergies  Amoxapine and related; Amoxicillin; Aspartame and phenylalanine; Dilaudid; Doxycycline; Flagyl; Keflex; Keppra; Neurontin; Provigil; Topamax; and Toradol  Home Medications   Prior to Admission medications   Medication Sig Start Date End Date Taking? Authorizing Provider  clidinium-chlordiazePOXIDE (LIBRAX) 5-2.5 MG per capsule Take 1 capsule by mouth.    Historical Provider, MD  clonazePAM (KLONOPIN) 0.5 MG tablet Take 0.5 mg by mouth 3 (three) times  daily.    Historical Provider, MD  divalproex (DEPAKOTE) 500 MG DR tablet Take 500 mg by mouth 3 (three) times daily.    Historical Provider, MD  eszopiclone (LUNESTA) 2 MG TABS tablet Take 3 mg by mouth at bedtime as needed for sleep. Take immediately before bedtime    Historical Provider, MD  furosemide (LASIX) 20 MG tablet Take 2 tablets (40 mg total) by mouth daily. 05/03/14   Shon Batonourtney F Horton, MD  haloperidol (HALDOL) 5 MG tablet Take 5 mg by mouth 2 (two) times daily.    Historical Provider, MD  hydrOXYzine (ATARAX/VISTARIL) 25 MG tablet Take 1 tablet (25 mg total) by mouth every 4 (four) hours as needed for itching. 05/11/14   Roscoe Witts L Nakai Pollio, MD  levothyroxine (SYNTHROID, LEVOTHROID) 75 MCG tablet Take 88 mcg by mouth daily before breakfast.     Historical Provider, MD  lithium 300 MG tablet Take 450 mg by mouth 3 (three) times daily.    Historical Provider, MD  loratadine (CLARITIN) 10 MG tablet Take 10 mg by mouth daily.    Historical Provider, MD  Milnacipran (SAVELLA) 50 MG TABS tablet Take 50 mg by mouth 2 (two) times daily.    Historical Provider, MD  montelukast (SINGULAIR) 10 MG tablet Take 10 mg by mouth at bedtime.    Historical Provider, MD  orphenadrine (NORFLEX) 100 MG tablet Take 1 tablet (100 mg total) by mouth 2 (two) times daily.  05/18/14   Enid SkeensJoshua M Zavitz, MD  oxyCODONE-acetaminophen (PERCOCET) 5-325 MG per tablet Take 1-2 tablets by mouth every 6 (six) hours as needed (for pain). 05/11/14   Niccolo Burggraf L Aysha Livecchi, MD  pindolol (VISKEN) 5 MG tablet Take 5 mg by mouth 2 (two) times daily.    Historical Provider, MD  Potassium Chloride CR (MICRO-K) 8 MEQ CPCR capsule CR Take 8 mEq by mouth.    Historical Provider, MD  primidone (MYSOLINE) 50 MG tablet Take by mouth 4 (four) times daily.    Historical Provider, MD  ramelteon (ROZEREM) 8 MG tablet Take 8 mg by mouth at bedtime.    Historical Provider, MD  ranitidine (ZANTAC) 300 MG tablet Take 300 mg by mouth at bedtime.    Historical  Provider, MD  risperidone (RISPERDAL) 4 MG tablet Take 4 mg by mouth 2 (two) times daily.    Historical Provider, MD  temazepam (RESTORIL) 30 MG capsule Take 45 mg by mouth at bedtime as needed for sleep.    Historical Provider, MD   BP 144/64  Pulse 95  Temp(Src) 97.4 F (36.3 C) (Oral)  Resp 18  Ht 5\' 2"  (1.575 m)  Wt 263 lb (119.296 kg)  BMI 48.09 kg/m2  SpO2 97%  Physical Exam General: Well-developed, well-nourished female in no acute distress; appearance consistent with age of record HENT: normocephalic; atraumatic Eyes: pupils equal, round and reactive to light; extraocular muscles intact Neck: supple Heart: regular rate and rhythm Lungs: clear to auscultation bilaterally Abdomen: soft; nondistended Extremities: No deformity; full range of motion; pulses normal; tenderness of the soft tissue of lower extremities bilaterally without edema or erythema Neurologic: Awake, alert and oriented; motor function intact in all extremities and symmetric; no facial droop Skin: Warm and dry Psychiatric: Normal mood and affect    ED Course  Procedures (including critical care time)  MDM  The patient was advised that it should be safe for her to take Percocet when she returns home. She states she has an adequate supply. She was advised to return for worsening pain or dark urine.    Hanley SeamenJohn L Cintia Gleed, MD 05/20/14 [phone]

## 2014-06-03 ENCOUNTER — Emergency Department (HOSPITAL_BASED_OUTPATIENT_CLINIC_OR_DEPARTMENT_OTHER)
Admission: EM | Admit: 2014-06-03 | Discharge: 2014-06-03 | Disposition: A | Discharge status: Home / Self Care | Payer: No Typology Code available for payment source | Attending: Emergency Medicine | Admitting: Emergency Medicine

## 2014-06-03 ENCOUNTER — Encounter (HOSPITAL_BASED_OUTPATIENT_CLINIC_OR_DEPARTMENT_OTHER): Payer: Self-pay | Admitting: Emergency Medicine

## 2014-06-03 DIAGNOSIS — Y929 Unspecified place or not applicable: Secondary | ICD-10-CM | POA: Insufficient documentation

## 2014-06-03 DIAGNOSIS — Y939 Activity, unspecified: Secondary | ICD-10-CM | POA: Insufficient documentation

## 2014-06-03 DIAGNOSIS — H538 Other visual disturbances: Secondary | ICD-10-CM | POA: Insufficient documentation

## 2014-06-03 DIAGNOSIS — S161XXA Strain of muscle, fascia and tendon at neck level, initial encounter: Secondary | ICD-10-CM

## 2014-06-03 DIAGNOSIS — F319 Bipolar disorder, unspecified: Secondary | ICD-10-CM | POA: Insufficient documentation

## 2014-06-03 DIAGNOSIS — X58XXXA Exposure to other specified factors, initial encounter: Secondary | ICD-10-CM | POA: Insufficient documentation

## 2014-06-03 DIAGNOSIS — S139XXA Sprain of joints and ligaments of unspecified parts of neck, initial encounter: Secondary | ICD-10-CM | POA: Insufficient documentation

## 2014-06-03 DIAGNOSIS — K219 Gastro-esophageal reflux disease without esophagitis: Secondary | ICD-10-CM | POA: Insufficient documentation

## 2014-06-03 DIAGNOSIS — G8929 Other chronic pain: Secondary | ICD-10-CM | POA: Insufficient documentation

## 2014-06-03 DIAGNOSIS — R51 Headache: Secondary | ICD-10-CM | POA: Insufficient documentation

## 2014-06-03 DIAGNOSIS — Z880 Allergy status to penicillin: Secondary | ICD-10-CM | POA: Insufficient documentation

## 2014-06-03 DIAGNOSIS — E079 Disorder of thyroid, unspecified: Secondary | ICD-10-CM | POA: Insufficient documentation

## 2014-06-03 DIAGNOSIS — G4700 Insomnia, unspecified: Secondary | ICD-10-CM | POA: Insufficient documentation

## 2014-06-03 DIAGNOSIS — Z79899 Other long term (current) drug therapy: Secondary | ICD-10-CM | POA: Insufficient documentation

## 2014-06-03 MED ORDER — DIAZEPAM 5 MG/ML IJ SOLN
10.0000 mg | Freq: Once | INTRAMUSCULAR | Status: DC
  Filled 2014-06-03: qty 2

## 2014-06-03 MED ORDER — DIAZEPAM 5 MG/ML IJ SOLN
10.0000 mg | Freq: Once | INTRAMUSCULAR | Status: AC
  Administered 2014-06-03: 10 mg via INTRAMUSCULAR

## 2014-06-03 NOTE — Discharge Instructions (Signed)

## 2014-06-03 NOTE — ED Notes (Signed)
C/o posterior neck pain that started this morning. Stated worse tonight. Describes pain as sharp and throbbing. Denies any recent injury. Pt drove herself to the ED

## 2014-06-03 NOTE — ED Provider Notes (Signed)
CSN: [id number]     Arrival date & time 06/03/14  0010 History   This chart was scribed for Hilario Quarryanielle S Shonique Pelphrey, MD by Modena JanskyAlbert Thayil, ED Scribe. This patient was seen in room MH06/MH06 and the patient's care was started at 12:24 AM.  Chief Complaint  Patient presents with  . Neck Pain   Patient is a 38 y.o. female presenting with headaches. The history is provided by the patient. No language interpreter was used.  Headache Pain location:  Generalized Quality:  Sharp Duration:  10 hours Timing:  Constant Progression:  Unchanged Associated symptoms: neck pain    HPI Comments: Georgiann MohsHolly Merrihew is a 38 y.o. female who presents to the Emergency Department complaining of a sharp moderate constant headache that started this morning. She reports of associated neck pain and blurry vision. She states that she has a hx of whiplash and that she periodically has neck pain. She states that she took percocet with temporary relief.   Past Medical History  Diagnosis Date  . GERD (gastroesophageal reflux disease)   . Thyroid disease   . Insomnia   . Bipolar 1 disorder   . Morbid obesity   . IBS (irritable bowel syndrome)   . Whiplash    Past Surgical History  Procedure Laterality Date  . Cholecystectomy    . Tympanostomy tube placement     No family history on file. History  Substance Use Topics  . Smoking status: Never Smoker   . Smokeless tobacco: Not on file  . Alcohol Use: No   OB History   Grav Para Term Preterm Abortions TAB SAB Ect Mult Living                 Review of Systems  Eyes: Positive for visual disturbance.  Musculoskeletal: Positive for neck pain.  Neurological: Positive for headaches.  All other systems reviewed and are negative.     Allergies  Amoxapine and related; Amoxicillin; Aspartame and phenylalanine; Dilaudid; Doxycycline; Flagyl; Keflex; Keppra; Neurontin; Provigil; Topamax; and Toradol  Home Medications   Prior to Admission medications   Medication Sig Start  Date End Date Taking? Authorizing Provider  clidinium-chlordiazePOXIDE (LIBRAX) 5-2.5 MG per capsule Take 1 capsule by mouth.    Historical Provider, MD  clonazePAM (KLONOPIN) 0.5 MG tablet Take 0.5 mg by mouth 3 (three) times daily.    Historical Provider, MD  divalproex (DEPAKOTE) 500 MG DR tablet Take 500 mg by mouth 3 (three) times daily.    Historical Provider, MD  eszopiclone (LUNESTA) 2 MG TABS tablet Take 3 mg by mouth at bedtime as needed for sleep. Take immediately before bedtime    Historical Provider, MD  furosemide (LASIX) 20 MG tablet Take 2 tablets (40 mg total) by mouth daily. 05/03/14   Shon Batonourtney F Horton, MD  haloperidol (HALDOL) 5 MG tablet Take 5 mg by mouth 2 (two) times daily.    Historical Provider, MD  hydrOXYzine (ATARAX/VISTARIL) 25 MG tablet Take 1 tablet (25 mg total) by mouth every 4 (four) hours as needed for itching. 05/11/14   John L Molpus, MD  levothyroxine (SYNTHROID, LEVOTHROID) 75 MCG tablet Take 88 mcg by mouth daily before breakfast.     Historical Provider, MD  lithium 300 MG tablet Take 450 mg by mouth 3 (three) times daily.    Historical Provider, MD  loratadine (CLARITIN) 10 MG tablet Take 10 mg by mouth daily.    Historical Provider, MD  Milnacipran (SAVELLA) 50 MG TABS tablet Take 50 mg by  mouth 2 (two) times daily.    Historical Provider, MD  montelukast (SINGULAIR) 10 MG tablet Take 10 mg by mouth at bedtime.    Historical Provider, MD  orphenadrine (NORFLEX) 100 MG tablet Take 1 tablet (100 mg total) by mouth 2 (two) times daily. 05/18/14   Enid SkeensJoshua M Zavitz, MD  oxyCODONE-acetaminophen (PERCOCET) 5-325 MG per tablet Take 1-2 tablets by mouth every 6 (six) hours as needed (for pain). 05/11/14   John L Molpus, MD  pindolol (VISKEN) 5 MG tablet Take 5 mg by mouth 2 (two) times daily.    Historical Provider, MD  Potassium Chloride CR (MICRO-K) 8 MEQ CPCR capsule CR Take 8 mEq by mouth.    Historical Provider, MD  primidone (MYSOLINE) 50 MG tablet Take by mouth 4  (four) times daily.    Historical Provider, MD  ramelteon (ROZEREM) 8 MG tablet Take 8 mg by mouth at bedtime.    Historical Provider, MD  ranitidine (ZANTAC) 300 MG tablet Take 300 mg by mouth at bedtime.    Historical Provider, MD  risperidone (RISPERDAL) 4 MG tablet Take 4 mg by mouth 2 (two) times daily.    Historical Provider, MD  temazepam (RESTORIL) 30 MG capsule Take 45 mg by mouth at bedtime as needed for sleep.    Historical Provider, MD   BP 159/88  Pulse 117  Temp(Src) 98.3 F (36.8 C) (Oral)  Ht 5\' 2"  (1.575 m)  Wt 274 lb (124.286 kg)  BMI 50.10 kg/m2  SpO2 98% Physical Exam  Nursing note and vitals reviewed. Constitutional: She is oriented to person, place, and time. She appears well-developed and well-nourished. No distress.  Obese  HENT:  Head: Normocephalic and atraumatic.  Mouth/Throat: Oropharynx is clear and moist.  Eyes: Conjunctivae and EOM are normal. Pupils are equal, round, and reactive to light.  Neck: Normal range of motion. Neck supple.  Cardiovascular: Normal rate, regular rhythm and normal heart sounds.   No murmur heard. Pulmonary/Chest: Effort normal and breath sounds normal. No respiratory distress.  Abdominal: Soft. There is no tenderness. There is no rebound and no guarding.  Musculoskeletal: Normal range of motion. She exhibits no edema and no tenderness.  Neurological: She is alert and oriented to person, place, and time. No cranial nerve deficit. She exhibits normal muscle tone. Coordination normal.  Skin: Skin is warm. No rash noted.    ED Course  Procedures (including critical care time) DIAGNOSTIC STUDIES: Oxygen Saturation is 98% on RA, normal by my interpretation.    COORDINATION OF CARE: 12:28 AM- Pt advised of plan for treatment which includes medication and pt agrees.  Labs Review Labs Reviewed - No data to display  Imaging Review No results found.   EKG Interpretation None      MDM   Final diagnoses:  Cervical  strain, initial encounter  Chronic pain    I personally performed the services described in this documentation, which was scribed in my presence. The recorded information has been reviewed and considered.     Hilario Quarryanielle S Makena Murdock, MD 06/03/14 [phone]

## 2014-06-10 ENCOUNTER — Encounter (HOSPITAL_BASED_OUTPATIENT_CLINIC_OR_DEPARTMENT_OTHER): Payer: Self-pay | Admitting: Emergency Medicine

## 2014-06-10 ENCOUNTER — Emergency Department (HOSPITAL_BASED_OUTPATIENT_CLINIC_OR_DEPARTMENT_OTHER)
Admission: EM | Admit: 2014-06-10 | Discharge: 2014-06-10 | Disposition: A | Discharge status: Home / Self Care | Payer: No Typology Code available for payment source | Attending: Emergency Medicine | Admitting: Emergency Medicine

## 2014-06-10 DIAGNOSIS — F319 Bipolar disorder, unspecified: Secondary | ICD-10-CM | POA: Insufficient documentation

## 2014-06-10 DIAGNOSIS — M542 Cervicalgia: Secondary | ICD-10-CM | POA: Insufficient documentation

## 2014-06-10 DIAGNOSIS — Z79899 Other long term (current) drug therapy: Secondary | ICD-10-CM | POA: Insufficient documentation

## 2014-06-10 DIAGNOSIS — K219 Gastro-esophageal reflux disease without esophagitis: Secondary | ICD-10-CM | POA: Insufficient documentation

## 2014-06-10 DIAGNOSIS — Z3202 Encounter for pregnancy test, result negative: Secondary | ICD-10-CM | POA: Insufficient documentation

## 2014-06-10 DIAGNOSIS — E079 Disorder of thyroid, unspecified: Secondary | ICD-10-CM | POA: Insufficient documentation

## 2014-06-10 DIAGNOSIS — Z880 Allergy status to penicillin: Secondary | ICD-10-CM | POA: Insufficient documentation

## 2014-06-10 DIAGNOSIS — T438X5A Adverse effect of other psychotropic drugs, initial encounter: Secondary | ICD-10-CM | POA: Insufficient documentation

## 2014-06-10 DIAGNOSIS — G4700 Insomnia, unspecified: Secondary | ICD-10-CM | POA: Insufficient documentation

## 2014-06-10 DIAGNOSIS — G8929 Other chronic pain: Secondary | ICD-10-CM | POA: Insufficient documentation

## 2014-06-10 DIAGNOSIS — T50905A Adverse effect of unspecified drugs, medicaments and biological substances, initial encounter: Secondary | ICD-10-CM

## 2014-06-10 LAB — COMPREHENSIVE METABOLIC PANEL
ALBUMIN: 3.8 g/dL (ref 3.5–5.2)
ALK PHOS: 77 U/L (ref 39–117)
ALT: 49 U/L — ABNORMAL HIGH (ref 0–35)
ANION GAP: 15 (ref 5–15)
AST: 34 U/L (ref 0–37)
BILIRUBIN TOTAL: 0.2 mg/dL — AB (ref 0.3–1.2)
BUN: 12 mg/dL (ref 6–23)
CO2: 29 mEq/L (ref 19–32)
Calcium: 10.2 mg/dL (ref 8.4–10.5)
Chloride: 97 mEq/L (ref 96–112)
Creatinine, Ser: 0.6 mg/dL (ref 0.50–1.10)
GFR calc Af Amer: >90 mL/min (ref >90)
GFR calc non Af Amer: >90 mL/min (ref >90)
Glucose, Bld: 109 mg/dL — ABNORMAL HIGH (ref 70–99)
POTASSIUM: 4.5 meq/L (ref 3.7–5.3)
SODIUM: 141 meq/L (ref 137–147)
TOTAL PROTEIN: 7.8 g/dL (ref 6.0–8.3)

## 2014-06-10 LAB — CBC WITH DIFFERENTIAL/PLATELET
BASOS ABS: 0.1 10*3/uL (ref 0.0–0.1)
BASOS PCT: 1 % (ref 0–1)
EOS PCT: 1 % (ref 0–5)
Eosinophils Absolute: 0.1 10*3/uL (ref 0.0–0.7)
HEMATOCRIT: 38.4 % (ref 36.0–46.0)
HEMOGLOBIN: 12.3 g/dL (ref 12.0–15.0)
Lymphocytes Relative: 28 % (ref 12–46)
Lymphs Abs: 3 10*3/uL (ref 0.7–4.0)
MCH: 31.1 pg (ref 26.0–34.0)
MCHC: 32 g/dL (ref 30.0–36.0)
MCV: 97.2 fL (ref 78.0–100.0)
MONO ABS: 1 10*3/uL (ref 0.1–1.0)
MONOS PCT: 10 % (ref 3–12)
NEUTROS ABS: 6.6 10*3/uL (ref 1.7–7.7)
Neutrophils Relative %: 61 % (ref 43–77)
Platelets: 270 10*3/uL (ref 150–400)
RBC: 3.95 MIL/uL (ref 3.87–5.11)
RDW: 13.5 % (ref 11.5–15.5)
WBC: 10.7 10*3/uL — ABNORMAL HIGH (ref 4.0–10.5)

## 2014-06-10 LAB — URINALYSIS, ROUTINE W REFLEX MICROSCOPIC
Bilirubin Urine: NEGATIVE
Glucose, UA: NEGATIVE mg/dL
Ketones, ur: NEGATIVE mg/dL
Nitrite: NEGATIVE
PROTEIN: NEGATIVE mg/dL
Specific Gravity, Urine: 1.014 (ref 1.005–1.030)
UROBILINOGEN UA: 1 mg/dL (ref 0.0–1.0)
pH: 7.5 (ref 5.0–8.0)

## 2014-06-10 LAB — PREGNANCY, URINE: PREG TEST UR: NEGATIVE

## 2014-06-10 LAB — URINE MICROSCOPIC-ADD ON

## 2014-06-10 LAB — LITHIUM LEVEL: Lithium Lvl: 0.48 mEq/L — ABNORMAL LOW (ref 0.80–1.40)

## 2014-06-10 NOTE — ED Provider Notes (Signed)
CSN: [id number]     Arrival date & time 06/10/14  0124 History   First MD Initiated Contact with Patient 06/10/14 0141     Chief Complaint  Patient presents with  . Medication Reaction     (Consider location/radiation/quality/duration/timing/severity/associated sxs/prior Treatment) HPI This is a 38 year old female on multiple psychiatric medications for bipolar disorder. She had her lithium dose increased 4 days ago. She also got a shot of dexamethasone yesterday for her chronic neck pain. Since having her lithium dose adjusted she states she doesn't feel right. She feels "high" but has difficulty explaining what that means other than that she is having difficulty sleeping. She has had some nausea. She denies vomiting or diarrhea. She has had a headache and neck pain but has treated herself with her usual medications with adequate relief. She has not been urinating excessively. She has not noted difficulty with coordination. It is difficult for her to quantify the severity of her symptoms.  Past Medical History  Diagnosis Date  . GERD (gastroesophageal reflux disease)   . Thyroid disease   . Insomnia   . Bipolar 1 disorder   . Morbid obesity   . IBS (irritable bowel syndrome)   . Whiplash    Past Surgical History  Procedure Laterality Date  . Cholecystectomy    . Tympanostomy tube placement     History reviewed. No pertinent family history. History  Substance Use Topics  . Smoking status: Never Smoker   . Smokeless tobacco: Not on file  . Alcohol Use: No   OB History   Grav Para Term Preterm Abortions TAB SAB Ect Mult Living                 Review of Systems  All other systems reviewed and are negative.   Allergies  Amoxapine and related; Amoxicillin; Aspartame and phenylalanine; Dilaudid; Doxycycline; Flagyl; Keflex; Keppra; Neurontin; Provigil; Topamax; and Toradol  Home Medications   Prior to Admission medications   Medication Sig Start Date End Date Taking?  Authorizing Provider  clidinium-chlordiazePOXIDE (LIBRAX) 5-2.5 MG per capsule Take 1 capsule by mouth.   Yes Historical Provider, MD  clonazePAM (KLONOPIN) 0.5 MG tablet Take 0.5 mg by mouth 3 (three) times daily. 1mg  at lunchtime only   Yes Historical Provider, MD  divalproex (DEPAKOTE) 500 MG DR tablet Take 500 mg by mouth 3 (three) times daily.   Yes Historical Provider, MD  eszopiclone (LUNESTA) 2 MG TABS tablet Take 3 mg by mouth at bedtime as needed for sleep. Take immediately before bedtime   Yes Historical Provider, MD  furosemide (LASIX) 20 MG tablet Take 2 tablets (40 mg total) by mouth daily. 05/03/14  Yes Shon Baton, MD  haloperidol (HALDOL) 5 MG tablet Take 5 mg by mouth 2 (two) times daily.   Yes Historical Provider, MD  hydrOXYzine (ATARAX/VISTARIL) 25 MG tablet Take 1 tablet (25 mg total) by mouth every 4 (four) hours as needed for itching. 05/11/14  Yes Novie Maggio L Lesly Joslyn, MD  levothyroxine (SYNTHROID, LEVOTHROID) 75 MCG tablet Take 88 mcg by mouth daily before breakfast.    Yes Historical Provider, MD  lithium 300 MG tablet Take 450 mg by mouth at bedtime.    Yes Historical Provider, MD  loratadine (CLARITIN) 10 MG tablet Take 10 mg by mouth daily.   Yes Historical Provider, MD  Milnacipran (SAVELLA) 50 MG TABS tablet Take 50 mg by mouth 2 (two) times daily.   Yes Historical Provider, MD  montelukast (SINGULAIR) 10 MG tablet  Take 10 mg by mouth at bedtime.   Yes Historical Provider, MD  orphenadrine (NORFLEX) 100 MG tablet Take 1 tablet (100 mg total) by mouth 2 (two) times daily. 05/18/14  Yes Enid Skeens, MD  oxyCODONE-acetaminophen (PERCOCET) 5-325 MG per tablet Take 1-2 tablets by mouth every 6 (six) hours as needed (for pain). 05/11/14  Yes Andreas Sobolewski L Camila Maita, MD  pindolol (VISKEN) 5 MG tablet Take 5 mg by mouth 2 (two) times daily.   Yes Historical Provider, MD  Potassium Chloride CR (MICRO-K) 8 MEQ CPCR capsule CR Take 8 mEq by mouth.   Yes Historical Provider, MD  primidone  (MYSOLINE) 50 MG tablet Take by mouth 4 (four) times daily.   Yes Historical Provider, MD  ramelteon (ROZEREM) 8 MG tablet Take 8 mg by mouth at bedtime.   Yes Historical Provider, MD  ranitidine (ZANTAC) 300 MG tablet Take 300 mg by mouth at bedtime.   Yes Historical Provider, MD  risperidone (RISPERDAL) 4 MG tablet Take 4 mg by mouth 2 (two) times daily.   Yes Historical Provider, MD  temazepam (RESTORIL) 30 MG capsule Take 45 mg by mouth at bedtime as needed for sleep.   Yes Historical Provider, MD   BP 128/93  Pulse 108  Temp(Src) 98.1 F (36.7 C) (Oral)  Resp 24  Ht 5\' 2"  (1.575 m)  Wt 272 lb (123.378 kg)  BMI 49.74 kg/m2  SpO2 96%  Physical Exam General: Well-developed, well-nourished female in no acute distress; appearance consistent with age of record HENT: normocephalic; atraumatic Eyes: pupils equal, round and reactive to light; extraocular muscles intact Neck: supple Heart: regular rate and rhythm Lungs: clear to auscultation bilaterally Abdomen: soft; nondistended; nontender; no masses or hepatosplenomegaly; bowel sounds present Extremities: No deformity; full range of motion; pulses normal Neurologic: Awake, alert and oriented; motor function intact in all extremities and symmetric; no facial droop; normal coordination and speech; normal Romberg; normal finger to nose Skin: Warm and dry Psychiatric: Normal mood and affect    ED Course  Procedures (including critical care time)  MDM   Nursing notes and vitals signs, including pulse oximetry, reviewed.  Summary of this visit's results, reviewed by myself:  Labs:  Results for orders placed during the hospital encounter of 06/10/14 (from the past 24 hour(s))  LITHIUM LEVEL     Status: Abnormal   Collection Time    06/10/14  1:55 AM      Result Value Ref Range   Lithium Lvl 0.48 (*) 0.80 - 1.40 mEq/L  COMPREHENSIVE METABOLIC PANEL     Status: Abnormal   Collection Time    06/10/14  1:55 AM      Result Value  Ref Range   Sodium 141  137 - 147 mEq/L   Potassium 4.5  3.7 - 5.3 mEq/L   Chloride 97  96 - 112 mEq/L   CO2 29  19 - 32 mEq/L   Glucose, Bld 109 (*) 70 - 99 mg/dL   BUN 12  6 - 23 mg/dL   Creatinine, Ser 1.61  0.50 - 1.10 mg/dL   Calcium 09.6  8.4 - 04.5 mg/dL   Total Protein 7.8  6.0 - 8.3 g/dL   Albumin 3.8  3.5 - 5.2 g/dL   AST 34  0 - 37 U/L   ALT 49 (*) 0 - 35 U/L   Alkaline Phosphatase 77  39 - 117 U/L   Total Bilirubin 0.2 (*) 0.3 - 1.2 mg/dL   GFR calc non  Af Amer >90  >90 mL/min   GFR calc Af Amer >90  >90 mL/min   Anion gap 15  5 - 15  CBC WITH DIFFERENTIAL     Status: Abnormal   Collection Time    06/10/14  1:55 AM      Result Value Ref Range   WBC 10.7 (*) 4.0 - 10.5 K/uL   RBC 3.95  3.87 - 5.11 MIL/uL   Hemoglobin 12.3  12.0 - 15.0 g/dL   HCT 78.238.4  95.636.0 - 21.346.0 %   MCV 97.2  78.0 - 100.0 fL   MCH 31.1  26.0 - 34.0 pg   MCHC 32.0  30.0 - 36.0 g/dL   RDW 08.613.5  57.811.5 - 46.915.5 %   Platelets 270  150 - 400 K/uL   Neutrophils Relative % 61  43 - 77 %   Neutro Abs 6.6  1.7 - 7.7 K/uL   Lymphocytes Relative 28  12 - 46 %   Lymphs Abs 3.0  0.7 - 4.0 K/uL   Monocytes Relative 10  3 - 12 %   Monocytes Absolute 1.0  0.1 - 1.0 K/uL   Eosinophils Relative 1  0 - 5 %   Eosinophils Absolute 0.1  0.0 - 0.7 K/uL   Basophils Relative 1  0 - 1 %   Basophils Absolute 0.1  0.0 - 0.1 K/uL  URINALYSIS, ROUTINE W REFLEX MICROSCOPIC     Status: Abnormal   Collection Time    06/10/14  2:09 AM      Result Value Ref Range   Color, Urine YELLOW  YELLOW   APPearance CLEAR  CLEAR   Specific Gravity, Urine 1.014  1.005 - 1.030   pH 7.5  5.0 - 8.0   Glucose, UA NEGATIVE  NEGATIVE mg/dL   Hgb urine dipstick TRACE (*) NEGATIVE   Bilirubin Urine NEGATIVE  NEGATIVE   Ketones, ur NEGATIVE  NEGATIVE mg/dL   Protein, ur NEGATIVE  NEGATIVE mg/dL   Urobilinogen, UA 1.0  0.0 - 1.0 mg/dL   Nitrite NEGATIVE  NEGATIVE   Leukocytes, UA TRACE (*) NEGATIVE  PREGNANCY, URINE     Status: None    Collection Time    06/10/14  2:09 AM      Result Value Ref Range   Preg Test, Ur NEGATIVE  NEGATIVE  URINE MICROSCOPIC-ADD ON     Status: Abnormal   Collection Time    06/10/14  2:09 AM      Result Value Ref Range   Squamous Epithelial / LPF FEW (*) RARE   WBC, UA 3-6  <3 WBC/hpf   RBC / HPF 0-2  <3 RBC/hpf   Bacteria, UA FEW (*) RARE   4:05 AM Patient was advised of the level but is still subtherapeutic. Her dysphoric reaction may actually be due to the dexamethasone she received. She was advised to contact her physician on Monday morning.     Hanley SeamenJohn L Evelise Reine, MD 06/10/14 [phone]

## 2014-06-10 NOTE — ED Notes (Signed)
MD with pt  

## 2014-06-10 NOTE — ED Notes (Signed)
Pt reports her lithium dose was doubled on Wednesday - reports she thinks she is feeling bad due to that. Pt is concerned that her lithium dosage is too high. Pt reports feeling "high" - c/o insomnia, anxiety.

## 2014-06-25 ENCOUNTER — Encounter (HOSPITAL_BASED_OUTPATIENT_CLINIC_OR_DEPARTMENT_OTHER): Payer: Self-pay | Admitting: Emergency Medicine

## 2014-06-25 DIAGNOSIS — Z880 Allergy status to penicillin: Secondary | ICD-10-CM | POA: Insufficient documentation

## 2014-06-25 DIAGNOSIS — Z79899 Other long term (current) drug therapy: Secondary | ICD-10-CM | POA: Insufficient documentation

## 2014-06-25 DIAGNOSIS — K589 Irritable bowel syndrome without diarrhea: Secondary | ICD-10-CM | POA: Insufficient documentation

## 2014-06-25 DIAGNOSIS — K219 Gastro-esophageal reflux disease without esophagitis: Secondary | ICD-10-CM | POA: Insufficient documentation

## 2014-06-25 DIAGNOSIS — R5383 Other fatigue: Principal | ICD-10-CM

## 2014-06-25 DIAGNOSIS — Z87828 Personal history of other (healed) physical injury and trauma: Secondary | ICD-10-CM | POA: Insufficient documentation

## 2014-06-25 DIAGNOSIS — R5381 Other malaise: Secondary | ICD-10-CM | POA: Insufficient documentation

## 2014-06-25 DIAGNOSIS — G4700 Insomnia, unspecified: Secondary | ICD-10-CM | POA: Insufficient documentation

## 2014-06-25 DIAGNOSIS — E079 Disorder of thyroid, unspecified: Secondary | ICD-10-CM | POA: Insufficient documentation

## 2014-06-25 DIAGNOSIS — F319 Bipolar disorder, unspecified: Secondary | ICD-10-CM | POA: Insufficient documentation

## 2014-06-25 NOTE — ED Notes (Signed)
Pt c/o generalized fatigue and insomnia ? Lithium level high

## 2014-06-26 ENCOUNTER — Emergency Department (HOSPITAL_BASED_OUTPATIENT_CLINIC_OR_DEPARTMENT_OTHER)
Admission: EM | Admit: 2014-06-26 | Discharge: 2014-06-26 | Disposition: A | Discharge status: Home / Self Care | Payer: No Typology Code available for payment source | Attending: Emergency Medicine | Admitting: Emergency Medicine

## 2014-06-26 DIAGNOSIS — R5383 Other fatigue: Secondary | ICD-10-CM

## 2014-06-26 LAB — LITHIUM LEVEL: Lithium Lvl: 0.58 mEq/L — ABNORMAL LOW (ref 0.80–1.40)

## 2014-06-26 NOTE — ED Provider Notes (Signed)
CSN: [id number]     Arrival date & time 06/25/14  2352 History   First MD Initiated Contact with Patient 06/26/14 0205     Chief Complaint  Patient presents with  . Medication Reaction     (Consider location/radiation/quality/duration/timing/severity/associated sxs/prior Treatment) The history is provided by the patient.  Patient was seen for same on 06/10/14.  States she is fatigued and feeling off since increased dose of her lithium.  Is now concerned mybetriq is interacting with the lithium.  No weakness nor numbness.  No HA.  No rashes on the skin.  No CP no SOB, no wheezing.  No fevers, no rigidity.    Past Medical History  Diagnosis Date  . GERD (gastroesophageal reflux disease)   . Thyroid disease   . Insomnia   . Bipolar 1 disorder   . Morbid obesity   . IBS (irritable bowel syndrome)   . Whiplash    Past Surgical History  Procedure Laterality Date  . Cholecystectomy    . Tympanostomy tube placement     History reviewed. No pertinent family history. History  Substance Use Topics  . Smoking status: Never Smoker   . Smokeless tobacco: Not on file  . Alcohol Use: No   OB History   Grav Para Term Preterm Abortions TAB SAB Ect Mult Living                 Review of Systems  Constitutional: Negative for fever and appetite change.  HENT: Negative for drooling, facial swelling and trouble swallowing.   Eyes: Negative for photophobia.  Genitourinary: Negative for frequency.  Musculoskeletal: Negative for arthralgias, myalgias and neck stiffness.  Skin: Negative for rash.  Neurological: Negative for dizziness, facial asymmetry, weakness, light-headedness and numbness.  All other systems reviewed and are negative.     Allergies  Amoxapine and related; Amoxicillin; Aspartame and phenylalanine; Dilaudid; Doxycycline; Flagyl; Keflex; Keppra; Neurontin; Provigil; Topamax; and Toradol  Home Medications   Prior to Admission medications   Medication Sig Start Date End  Date Taking? Authorizing Provider  clidinium-chlordiazePOXIDE (LIBRAX) 5-2.5 MG per capsule Take 1 capsule by mouth.    Historical Provider, MD  clonazePAM (KLONOPIN) 0.5 MG tablet Take 0.5 mg by mouth 3 (three) times daily. 1mg  at lunchtime only    Historical Provider, MD  divalproex (DEPAKOTE) 500 MG DR tablet Take 500 mg by mouth 3 (three) times daily.    Historical Provider, MD  eszopiclone (LUNESTA) 2 MG TABS tablet Take 3 mg by mouth at bedtime as needed for sleep. Take immediately before bedtime    Historical Provider, MD  furosemide (LASIX) 20 MG tablet Take 2 tablets (40 mg total) by mouth daily. 05/03/14   Shon Baton, MD  haloperidol (HALDOL) 5 MG tablet Take 5 mg by mouth 2 (two) times daily.    Historical Provider, MD  hydrOXYzine (ATARAX/VISTARIL) 25 MG tablet Take 1 tablet (25 mg total) by mouth every 4 (four) hours as needed for itching. 05/11/14   John L Molpus, MD  levothyroxine (SYNTHROID, LEVOTHROID) 75 MCG tablet Take 88 mcg by mouth daily before breakfast.     Historical Provider, MD  lithium 300 MG tablet Take 450 mg by mouth at bedtime.     Historical Provider, MD  loratadine (CLARITIN) 10 MG tablet Take 10 mg by mouth daily.    Historical Provider, MD  Milnacipran (SAVELLA) 50 MG TABS tablet Take 50 mg by mouth 2 (two) times daily.    Historical Provider, MD  montelukast (  SINGULAIR) 10 MG tablet Take 10 mg by mouth at bedtime.    Historical Provider, MD  orphenadrine (NORFLEX) 100 MG tablet Take 1 tablet (100 mg total) by mouth 2 (two) times daily. 05/18/14   Enid SkeensJoshua M Zavitz, MD  oxyCODONE-acetaminophen (PERCOCET) 5-325 MG per tablet Take 1-2 tablets by mouth every 6 (six) hours as needed (for pain). 05/11/14   John L Molpus, MD  pindolol (VISKEN) 5 MG tablet Take 5 mg by mouth 2 (two) times daily.    Historical Provider, MD  Potassium Chloride CR (MICRO-K) 8 MEQ CPCR capsule CR Take 8 mEq by mouth.    Historical Provider, MD  primidone (MYSOLINE) 50 MG tablet Take by mouth  4 (four) times daily.    Historical Provider, MD  ramelteon (ROZEREM) 8 MG tablet Take 8 mg by mouth at bedtime.    Historical Provider, MD  ranitidine (ZANTAC) 300 MG tablet Take 300 mg by mouth at bedtime.    Historical Provider, MD  risperidone (RISPERDAL) 4 MG tablet Take 4 mg by mouth 2 (two) times daily.    Historical Provider, MD  temazepam (RESTORIL) 30 MG capsule Take 45 mg by mouth at bedtime as needed for sleep.    Historical Provider, MD   BP 125/70  Pulse 98  Temp(Src) 97.8 F (36.6 C) (Oral)  Resp 16  Ht 5\' 2"  (1.575 m)  Wt 270 lb (122.471 kg)  BMI 49.37 kg/m2  SpO2 98% Physical Exam  Constitutional: She is oriented to person, place, and time. She appears well-developed and well-nourished. No distress.  HENT:  Head: Normocephalic and atraumatic.  Mouth/Throat: Oropharynx is clear and moist. No oropharyngeal exudate.  No swelling or the lips tongue or uvula  Eyes: Conjunctivae and EOM are normal. Pupils are equal, round, and reactive to light.  Neck: Normal range of motion. Neck supple.  Cardiovascular: Normal rate and intact distal pulses.   Pulmonary/Chest: Effort normal and breath sounds normal. No stridor. No respiratory distress. She has no wheezes. She has no rales.  Abdominal: Soft. Bowel sounds are normal. There is no tenderness. There is no rebound and no guarding.  Musculoskeletal: Normal range of motion.  Lymphadenopathy:    She has no cervical adenopathy.  Neurological: She is alert and oriented to person, place, and time. She has normal reflexes.  No rigidity FROM normal gait  Skin: Skin is warm and dry. No rash noted. She is not diaphoretic. No erythema.  Psychiatric: Her mood appears anxious.    ED Course  Procedures (including critical care time) Labs Review Labs Reviewed  LITHIUM LEVEL    Imaging Review No results found.   EKG Interpretation None      MDM   Final diagnoses:  None    Patient is on a lot of medication, quite a few  are sedating.  Recommend follow up with PMD for coordination of medication and withdrawing some of medication.  Patient has a history of insomnia [facility].  This is chronic.  Patient has not mentioned any of this to her therapist and he reports in the notes she is doing well despite having been seen for this problem in our ED twice.  Recommend follow up with therapist this week as well    Efrata Brunner K Elza Sortor-Rasch, MD 06/26/14 [phone]

## 2014-06-26 NOTE — Discharge Instructions (Signed)

## 2014-07-14 ENCOUNTER — Emergency Department (HOSPITAL_BASED_OUTPATIENT_CLINIC_OR_DEPARTMENT_OTHER): Payer: No Typology Code available for payment source

## 2014-07-14 ENCOUNTER — Emergency Department (HOSPITAL_BASED_OUTPATIENT_CLINIC_OR_DEPARTMENT_OTHER)
Admission: EM | Admit: 2014-07-14 | Discharge: 2014-07-14 | Disposition: A | Discharge status: Home / Self Care | Payer: No Typology Code available for payment source | Attending: Emergency Medicine | Admitting: Emergency Medicine

## 2014-07-14 ENCOUNTER — Encounter (HOSPITAL_BASED_OUTPATIENT_CLINIC_OR_DEPARTMENT_OTHER): Payer: Self-pay | Admitting: Emergency Medicine

## 2014-07-14 DIAGNOSIS — E079 Disorder of thyroid, unspecified: Secondary | ICD-10-CM | POA: Insufficient documentation

## 2014-07-14 DIAGNOSIS — F319 Bipolar disorder, unspecified: Secondary | ICD-10-CM | POA: Diagnosis not present

## 2014-07-14 DIAGNOSIS — Z880 Allergy status to penicillin: Secondary | ICD-10-CM | POA: Diagnosis not present

## 2014-07-14 DIAGNOSIS — M62838 Other muscle spasm: Secondary | ICD-10-CM | POA: Insufficient documentation

## 2014-07-14 DIAGNOSIS — Z79899 Other long term (current) drug therapy: Secondary | ICD-10-CM | POA: Insufficient documentation

## 2014-07-14 DIAGNOSIS — K589 Irritable bowel syndrome without diarrhea: Secondary | ICD-10-CM | POA: Diagnosis not present

## 2014-07-14 DIAGNOSIS — K219 Gastro-esophageal reflux disease without esophagitis: Secondary | ICD-10-CM | POA: Diagnosis not present

## 2014-07-14 DIAGNOSIS — R51 Headache: Secondary | ICD-10-CM | POA: Diagnosis present

## 2014-07-14 DIAGNOSIS — M542 Cervicalgia: Secondary | ICD-10-CM | POA: Diagnosis not present

## 2014-07-14 NOTE — Discharge Instructions (Signed)
You may take ibuprofen, 600-800 mg every 6-8 hours for pain.  Muscle Cramps and Spasms Muscle cramps and spasms occur when a muscle or muscles tighten and you have no control over this tightening (involuntary muscle contraction). They are a common problem and can develop in any muscle. The most common place is in the calf muscles of the leg. Both muscle cramps and muscle spasms are involuntary muscle contractions, but they also have differences:   Muscle cramps are sporadic and painful. They may last a few seconds to a quarter of an hour. Muscle cramps are often more forceful and last longer than muscle spasms.  Muscle spasms may or may not be painful. They may also last just a few seconds or much longer. CAUSES  It is uncommon for cramps or spasms to be due to a serious underlying problem. In many cases, the cause of cramps or spasms is unknown. Some common causes are:   Overexertion.   Overuse from repetitive motions (doing the same thing over and over).   Remaining in a certain position for a long period of time.   Improper preparation, form, or technique while performing a sport or activity.   Dehydration.   Injury.   Side effects of some medicines.   Abnormally low levels of the salts and ions in your blood (electrolytes), especially potassium and calcium. This could happen if you are taking water pills (diuretics) or you are pregnant.  Some underlying medical problems can make it more likely to develop cramps or spasms. These include, but are not limited to:   Diabetes.   Parkinson disease.   Hormone disorders, such as thyroid problems.   Alcohol abuse.   Diseases specific to muscles, joints, and bones.   Blood vessel disease where not enough blood is getting to the muscles.  HOME CARE INSTRUCTIONS   Stay well hydrated. Drink enough water and fluids to keep your urine clear or pale yellow.  It may be helpful to massage, stretch, and relax the affected  muscle.  For tight or tense muscles, use a warm towel, heating pad, or hot shower water directed to the affected area.  If you are sore or have pain after a cramp or spasm, applying ice to the affected area may relieve discomfort.  Put ice in a plastic bag.  Place a towel between your skin and the bag.  Leave the ice on for 15-20 minutes, 03-04 times a day.  Medicines used to treat a known cause of cramps or spasms may help reduce their frequency or severity. Only take over-the-counter or prescription medicines as directed by your caregiver. SEEK MEDICAL CARE IF:  Your cramps or spasms get more severe, more frequent, or do not improve over time.  MAKE SURE YOU:   Understand these instructions.  Will watch your condition.  Will get help right away if you are not doing well or get worse. Document Released: 05/08/2002 Document Revised: 03/13/2013 Document Reviewed: 11/02/2012 [facility] Patient Information 2015 [place], [place]. This information is not intended to replace advice given to you by your health care provider. Make sure you discuss any questions you have with your health care provider.

## 2014-07-14 NOTE — ED Notes (Signed)
Head and neck pain

## 2014-07-14 NOTE — ED Provider Notes (Signed)
CSN: [id number]     Arrival date & time 07/14/14  2214 History   First MD Initiated Contact with Patient 07/14/14 2225     Chief Complaint  Patient presents with  . Headache     (Consider location/radiation/quality/duration/timing/severity/associated sxs/prior Treatment) HPI Comments: Patient is a 38 year old obese female with a past medical history of fibromyalgia, GERD, bipolar disorder, thyroid disease and IBS who presents to the emergency department complaining of left-sided neck pain radiating towards her head beginning around 8:00 PM tonight, about 2 hours prior to arrival. Patient reports she was sitting on the couch talking to her husband when she felt a "twinge" in the left side of her neck that radiated up the side of her head to her forehead. Neck pain is starting to subside, however now she is starting to hurt in the back of her neck. Pain rated 8/10, worse with certain movements or if she touches her forehead. States her entire body is now starting to hurt. She tried taking a percocet and norflex, both of which "didn't touch the pain". Denies fever, chills, lightheadedness, dizziness or vision change.  Patient is a 38 y.o. female presenting with headaches. The history is provided by the patient.  Headache Associated symptoms: neck pain     Past Medical History  Diagnosis Date  . GERD (gastroesophageal reflux disease)   . Thyroid disease   . Insomnia   . Bipolar 1 disorder   . Morbid obesity   . IBS (irritable bowel syndrome)   . Whiplash    Past Surgical History  Procedure Laterality Date  . Cholecystectomy    . Tympanostomy tube placement     No family history on file. History  Substance Use Topics  . Smoking status: Never Smoker   . Smokeless tobacco: Not on file  . Alcohol Use: No   OB History   Grav Para Term Preterm Abortions TAB SAB Ect Mult Living                 Review of Systems  Musculoskeletal: Positive for neck pain.  Neurological: Positive for  headaches.  All other systems reviewed and are negative.     Allergies  Amoxapine and related; Amoxicillin; Aspartame and phenylalanine; Dilaudid; Doxycycline; Flagyl; Keflex; Keppra; Neurontin; Provigil; Topamax; and Toradol  Home Medications   Prior to Admission medications   Medication Sig Start Date End Date Taking? Authorizing Provider  clidinium-chlordiazePOXIDE (LIBRAX) 5-2.5 MG per capsule Take 1 capsule by mouth.    Historical Provider, MD  clonazePAM (KLONOPIN) 0.5 MG tablet Take 0.5 mg by mouth 3 (three) times daily. 1mg  at lunchtime only    Historical Provider, MD  divalproex (DEPAKOTE) 500 MG DR tablet Take 500 mg by mouth 3 (three) times daily.    Historical Provider, MD  eszopiclone (LUNESTA) 2 MG TABS tablet Take 3 mg by mouth at bedtime as needed for sleep. Take immediately before bedtime    Historical Provider, MD  furosemide (LASIX) 20 MG tablet Take 2 tablets (40 mg total) by mouth daily. 05/03/14   Shon Batonourtney F Horton, MD  haloperidol (HALDOL) 5 MG tablet Take 5 mg by mouth 2 (two) times daily.    Historical Provider, MD  hydrOXYzine (ATARAX/VISTARIL) 25 MG tablet Take 1 tablet (25 mg total) by mouth every 4 (four) hours as needed for itching. 05/11/14   John L Molpus, MD  levothyroxine (SYNTHROID, LEVOTHROID) 75 MCG tablet Take 88 mcg by mouth daily before breakfast.     Historical Provider, MD  lithium 300 MG tablet Take 450 mg by mouth at bedtime.     Historical Provider, MD  loratadine (CLARITIN) 10 MG tablet Take 10 mg by mouth daily.    Historical Provider, MD  Milnacipran (SAVELLA) 50 MG TABS tablet Take 50 mg by mouth 2 (two) times daily.    Historical Provider, MD  montelukast (SINGULAIR) 10 MG tablet Take 10 mg by mouth at bedtime.    Historical Provider, MD  orphenadrine (NORFLEX) 100 MG tablet Take 1 tablet (100 mg total) by mouth 2 (two) times daily. 05/18/14   Enid Skeens, MD  oxyCODONE-acetaminophen (PERCOCET) 5-325 MG per tablet Take 1-2 tablets by mouth  every 6 (six) hours as needed (for pain). 05/11/14   John L Molpus, MD  pindolol (VISKEN) 5 MG tablet Take 5 mg by mouth 2 (two) times daily.    Historical Provider, MD  Potassium Chloride CR (MICRO-K) 8 MEQ CPCR capsule CR Take 8 mEq by mouth.    Historical Provider, MD  primidone (MYSOLINE) 50 MG tablet Take by mouth 4 (four) times daily.    Historical Provider, MD  ramelteon (ROZEREM) 8 MG tablet Take 8 mg by mouth at bedtime.    Historical Provider, MD  ranitidine (ZANTAC) 300 MG tablet Take 300 mg by mouth at bedtime.    Historical Provider, MD  risperidone (RISPERDAL) 4 MG tablet Take 4 mg by mouth 2 (two) times daily.    Historical Provider, MD  temazepam (RESTORIL) 30 MG capsule Take 45 mg by mouth at bedtime as needed for sleep.    Historical Provider, MD   BP 111/66  Pulse 100  Temp(Src) 98.3 F (36.8 C) (Oral)  Resp 18  SpO2 98% Physical Exam  Nursing note and vitals reviewed. Constitutional: She is oriented to person, place, and time. She appears well-developed and well-nourished. No distress.  Morbidly obese.  HENT:  Head: Normocephalic and atraumatic.  Mouth/Throat: Oropharynx is clear and moist.  Eyes: Conjunctivae and EOM are normal. Pupils are equal, round, and reactive to light.  Neck: Normal range of motion. Neck supple.  No meningeal signs.  Cardiovascular: Normal rate, regular rhythm, normal heart sounds and intact distal pulses.   Pulmonary/Chest: Effort normal and breath sounds normal. No respiratory distress.  Abdominal: Soft. Bowel sounds are normal. There is no tenderness.  Musculoskeletal: Normal range of motion. She exhibits no edema.  Tender to palpation over her cervical spine and left paraspinal muscles. Full range of motion.  Neurological: She is alert and oriented to person, place, and time. She has normal strength. No cranial nerve deficit or sensory deficit. Coordination and gait normal.  Speech fluent, goal oriented. Moves limbs without  ataxia. Equal grip strength bilateral. Normal gait.  Skin: Skin is warm and dry. No rash noted. She is not diaphoretic.  Psychiatric: She has a normal mood and affect. Her behavior is normal.    ED Course  Procedures (including critical care time) Labs Review Labs Reviewed - No data to display  Imaging Review Dg Cervical Spine Complete  07/14/2014   CLINICAL DATA:  Headache.  No known injury.  Left neck stiffness.  EXAM: CERVICAL SPINE  4+ VIEWS  COMPARISON:  None.  FINDINGS: There is no evidence of cervical spine fracture or prevertebral soft tissue swelling. Alignment is normal. No other significant bone abnormalities are identified.  IMPRESSION: Negative cervical spine radiographs.   Electronically Signed   By: Charlett Nose M.D.   On: 07/14/2014 22:54     EKG Interpretation  None      MDM   Final diagnoses:  Neck muscle spasm   Patient presenting with neck pain after feeling a "twinge". She is well appearing and in no apparent distress. VSS. Neuro flags concerning patient's neck pain. No focal neurologic deficits, no signs or symptoms of central cord compression, afebrile. Regarding headache, it is radiating pain from her neck and reproducible on her forehead to her skin. Cervical x-ray without any acute finding. Patient has full range of motion of her cervical spine without pain. She has Percocet and Norflex at home. Advised her to take ibuprofen as well. Followup with PCP. Stable for discharge. Return precautions given. Patient states understanding of treatment care plan and is agreeable.    Trevor Mace, PA-C 07/14/14 [phone]

## 2014-07-14 NOTE — ED Provider Notes (Signed)
History/physical exam/procedure(s) were performed by non-physician practitioner and as supervising physician I was immediately available for consultation/collaboration. I have reviewed all notes and am in agreement with care and plan.   Odis Wickey S Antwone Capozzoli, MD 07/14/14 2349 

## 2014-07-16 DIAGNOSIS — E079 Disorder of thyroid, unspecified: Secondary | ICD-10-CM | POA: Diagnosis not present

## 2014-07-16 DIAGNOSIS — F319 Bipolar disorder, unspecified: Secondary | ICD-10-CM | POA: Insufficient documentation

## 2014-07-16 DIAGNOSIS — M542 Cervicalgia: Secondary | ICD-10-CM | POA: Diagnosis not present

## 2014-07-16 DIAGNOSIS — Z880 Allergy status to penicillin: Secondary | ICD-10-CM | POA: Insufficient documentation

## 2014-07-16 DIAGNOSIS — R51 Headache: Secondary | ICD-10-CM | POA: Diagnosis present

## 2014-07-16 DIAGNOSIS — G43809 Other migraine, not intractable, without status migrainosus: Secondary | ICD-10-CM | POA: Insufficient documentation

## 2014-07-16 DIAGNOSIS — K589 Irritable bowel syndrome without diarrhea: Secondary | ICD-10-CM | POA: Diagnosis not present

## 2014-07-16 DIAGNOSIS — Z79899 Other long term (current) drug therapy: Secondary | ICD-10-CM | POA: Insufficient documentation

## 2014-07-16 DIAGNOSIS — J029 Acute pharyngitis, unspecified: Secondary | ICD-10-CM | POA: Insufficient documentation

## 2014-07-16 DIAGNOSIS — K219 Gastro-esophageal reflux disease without esophagitis: Secondary | ICD-10-CM | POA: Insufficient documentation

## 2014-07-16 NOTE — ED Notes (Signed)
Pt c/o "migaine" x 3 days pt requesting morphine for h/a

## 2014-07-17 ENCOUNTER — Encounter (HOSPITAL_BASED_OUTPATIENT_CLINIC_OR_DEPARTMENT_OTHER): Payer: Self-pay | Admitting: Emergency Medicine

## 2014-07-17 ENCOUNTER — Emergency Department (HOSPITAL_BASED_OUTPATIENT_CLINIC_OR_DEPARTMENT_OTHER)
Admission: EM | Admit: 2014-07-17 | Discharge: 2014-07-17 | Disposition: A | Discharge status: Home / Self Care | Payer: No Typology Code available for payment source | Attending: Emergency Medicine | Admitting: Emergency Medicine

## 2014-07-17 DIAGNOSIS — G43109 Migraine with aura, not intractable, without status migrainosus: Secondary | ICD-10-CM

## 2014-07-17 MED ORDER — DIPHENHYDRAMINE HCL 50 MG/ML IJ SOLN
12.5000 mg | Freq: Once | INTRAMUSCULAR | Status: AC
  Administered 2014-07-17: 12.5 mg via INTRAMUSCULAR
  Filled 2014-07-17: qty 1

## 2014-07-17 MED ORDER — METHOCARBAMOL 1000 MG/10ML IJ SOLN
1000.0000 mg | Freq: Once | INTRAMUSCULAR | Status: AC
  Administered 2014-07-17: 1000 mg via INTRAMUSCULAR
  Filled 2014-07-17: qty 10

## 2014-07-17 MED ORDER — METOCLOPRAMIDE HCL 5 MG/ML IJ SOLN
10.0000 mg | Freq: Once | INTRAMUSCULAR | Status: AC
  Administered 2014-07-17: 10 mg via INTRAMUSCULAR
  Filled 2014-07-17: qty 2

## 2014-07-17 MED ORDER — PROMETHAZINE HCL 25 MG/ML IJ SOLN
12.5000 mg | Freq: Once | INTRAMUSCULAR | Status: AC
  Administered 2014-07-17: 12.5 mg via INTRAVENOUS
  Filled 2014-07-17: qty 1

## 2014-07-17 NOTE — ED Provider Notes (Signed)
CSN: [id number]     Arrival date & time 07/16/14  2339 History  This chart was scribed for Lateia Fraser Smitty Cords, MD by Charline Bills, ED Scribe. The patient was seen in room MH02/MH02. Patient's care was started at 12:48 AM.   Chief Complaint  Patient presents with  . Migraine   Patient is a 38 y.o. female presenting with migraines. The history is provided by the patient. No language interpreter was used.  Migraine This is a recurrent problem. The current episode started more than 2 days ago. The problem occurs constantly. The problem has been gradually worsening. Associated symptoms include headaches. Pertinent negatives include no chest pain, no abdominal pain and no shortness of breath. Nothing aggravates the symptoms. Nothing relieves the symptoms. Treatments tried: narcotics. The treatment provided no relief.   HPI Comments: Jessica Nicholson is a 38 y.o. female who presents to the Emergency Department complaining of generalized migraine over the past 3 days. Pt reports associated neck spasm. She denies urinary symptoms or any other symptoms at this time. Pt was seen 3 night ago; advised to continue medication. Pt reports similar pain in the past; she states that she was given morphine at [facility] with relief. Pt has tried Percocet with no relief.   Past Medical History  Diagnosis Date  . GERD (gastroesophageal reflux disease)   . Thyroid disease   . Insomnia   . Bipolar 1 disorder   . Morbid obesity   . IBS (irritable bowel syndrome)   . Whiplash    Past Surgical History  Procedure Laterality Date  . Cholecystectomy    . Tympanostomy tube placement     No family history on file. History  Substance Use Topics  . Smoking status: Never Smoker   . Smokeless tobacco: Not on file  . Alcohol Use: No   OB History   Grav Para Term Preterm Abortions TAB SAB Ect Mult Living                 Review of Systems  Constitutional: Negative for fever.  HENT: Positive for sore throat.    Respiratory: Negative for shortness of breath.   Cardiovascular: Negative for chest pain.  Gastrointestinal: Negative for abdominal pain.  Genitourinary: Negative.   Musculoskeletal: Positive for neck pain. Negative for neck stiffness.  Skin: Negative for rash.  Neurological: Positive for headaches. Negative for dizziness, facial asymmetry, light-headedness and numbness.  All other systems reviewed and are negative.  Allergies  Amoxapine and related; Amoxicillin; Aspartame and phenylalanine; Dilaudid; Doxycycline; Flagyl; Keflex; Keppra; Neurontin; Provigil; Topamax; and Toradol  Home Medications   Prior to Admission medications   Medication Sig Start Date End Date Taking? Authorizing Provider  clidinium-chlordiazePOXIDE (LIBRAX) 5-2.5 MG per capsule Take 1 capsule by mouth.    Historical Provider, MD  clonazePAM (KLONOPIN) 0.5 MG tablet Take 0.5 mg by mouth 3 (three) times daily. 1mg  at lunchtime only    Historical Provider, MD  divalproex (DEPAKOTE) 500 MG DR tablet Take 500 mg by mouth 3 (three) times daily.    Historical Provider, MD  eszopiclone (LUNESTA) 2 MG TABS tablet Take 3 mg by mouth at bedtime as needed for sleep. Take immediately before bedtime    Historical Provider, MD  furosemide (LASIX) 20 MG tablet Take 2 tablets (40 mg total) by mouth daily. 05/03/14   Shon Baton, MD  haloperidol (HALDOL) 5 MG tablet Take 5 mg by mouth 2 (two) times daily.    Historical Provider, MD  hydrOXYzine (ATARAX/VISTARIL)  25 MG tablet Take 1 tablet (25 mg total) by mouth every 4 (four) hours as needed for itching. 05/11/14   John L Molpus, MD  levothyroxine (SYNTHROID, LEVOTHROID) 75 MCG tablet Take 88 mcg by mouth daily before breakfast.     Historical Provider, MD  lithium 300 MG tablet Take 450 mg by mouth at bedtime.     Historical Provider, MD  loratadine (CLARITIN) 10 MG tablet Take 10 mg by mouth daily.    Historical Provider, MD  Milnacipran (SAVELLA) 50 MG TABS tablet Take 50 mg by  mouth 2 (two) times daily.    Historical Provider, MD  montelukast (SINGULAIR) 10 MG tablet Take 10 mg by mouth at bedtime.    Historical Provider, MD  orphenadrine (NORFLEX) 100 MG tablet Take 1 tablet (100 mg total) by mouth 2 (two) times daily. 05/18/14   Enid SkeensJoshua M Zavitz, MD  oxyCODONE-acetaminophen (PERCOCET) 5-325 MG per tablet Take 1-2 tablets by mouth every 6 (six) hours as needed (for pain). 05/11/14   John L Molpus, MD  pindolol (VISKEN) 5 MG tablet Take 5 mg by mouth 2 (two) times daily.    Historical Provider, MD  Potassium Chloride CR (MICRO-K) 8 MEQ CPCR capsule CR Take 8 mEq by mouth.    Historical Provider, MD  primidone (MYSOLINE) 50 MG tablet Take by mouth 4 (four) times daily.    Historical Provider, MD  ramelteon (ROZEREM) 8 MG tablet Take 8 mg by mouth at bedtime.    Historical Provider, MD  ranitidine (ZANTAC) 300 MG tablet Take 300 mg by mouth at bedtime.    Historical Provider, MD  risperidone (RISPERDAL) 4 MG tablet Take 4 mg by mouth 2 (two) times daily.    Historical Provider, MD  temazepam (RESTORIL) 30 MG capsule Take 45 mg by mouth at bedtime as needed for sleep.    Historical Provider, MD   Triage Vitals: Pulse 100  Temp(Src) 99.4 F (37.4 C) (Oral)  Ht 5\' 2"  (1.575 m)  Wt 273 lb (123.832 kg)  BMI 49.92 kg/m2  SpO2 94% Physical Exam  Nursing note and vitals reviewed. Constitutional: She is oriented to person, place, and time. She appears well-developed and well-nourished.  HENT:  Head: Normocephalic and atraumatic.  Mouth/Throat: Oropharynx is clear and moist. No oropharyngeal exudate.  Eyes: Conjunctivae and EOM are normal. Pupils are equal, round, and reactive to light.  Neck: Normal range of motion. Neck supple. No Brudzinski's sign and no Kernig's sign noted.  No difficulty with Passive and active motion of the neck no meningismus  Cardiovascular: Normal rate and regular rhythm.   Pulmonary/Chest: Effort normal and breath sounds normal. She has no wheezes.  She has no rales.  Abdominal: Soft. Bowel sounds are normal. There is no tenderness. There is no rebound and no guarding.  Musculoskeletal: Normal range of motion.  Neurological: She is alert and oriented to person, place, and time. She has normal reflexes. She displays normal reflexes. No cranial nerve deficit. Coordination normal.  Skin: Skin is warm and dry.  Psychiatric: She has a normal mood and affect. Her behavior is normal.   ED Course  Procedures (including critical care time) DIAGNOSTIC STUDIES: Oxygen Saturation is 94% on RA, adequate by my interpretation.    COORDINATION OF CARE: 12:53 AM-Discussed treatment plan which includes medication for pain with pt at bedside and pt agreed to plan.   Labs Review Labs Reviewed - No data to display  Imaging Review No results found.   EKG Interpretation None  MDM   Final diagnoses:  None    No f/c/r No rashes no tick exposure.  Here often for same.  No indication for CT or LP will not treat chronic migraine with narcotics.    I personally performed the services described in this documentation, which was scribed in my presence. The recorded information has been reviewed and is accurate.  3  Luverne Farone K Yoali Conry-Rasch, MD 07/17/14 [id number]

## 2014-07-17 NOTE — ED Notes (Signed)
Entered patient room to obtain strep swab, patient denies sore throat. States her migraine has mostly resolved, pain is currently 3-4/10 and would like to be discharged. MD notified.

## 2014-07-22 ENCOUNTER — Encounter (HOSPITAL_BASED_OUTPATIENT_CLINIC_OR_DEPARTMENT_OTHER): Payer: Self-pay | Admitting: Emergency Medicine

## 2014-07-22 ENCOUNTER — Emergency Department (HOSPITAL_BASED_OUTPATIENT_CLINIC_OR_DEPARTMENT_OTHER)
Admission: EM | Admit: 2014-07-22 | Discharge: 2014-07-22 | Disposition: A | Discharge status: Home / Self Care | Payer: No Typology Code available for payment source | Attending: Emergency Medicine | Admitting: Emergency Medicine

## 2014-07-22 DIAGNOSIS — E079 Disorder of thyroid, unspecified: Secondary | ICD-10-CM | POA: Insufficient documentation

## 2014-07-22 DIAGNOSIS — F319 Bipolar disorder, unspecified: Secondary | ICD-10-CM | POA: Insufficient documentation

## 2014-07-22 DIAGNOSIS — R109 Unspecified abdominal pain: Secondary | ICD-10-CM | POA: Diagnosis not present

## 2014-07-22 DIAGNOSIS — R1084 Generalized abdominal pain: Secondary | ICD-10-CM | POA: Diagnosis present

## 2014-07-22 DIAGNOSIS — Z9089 Acquired absence of other organs: Secondary | ICD-10-CM | POA: Insufficient documentation

## 2014-07-22 DIAGNOSIS — K219 Gastro-esophageal reflux disease without esophagitis: Secondary | ICD-10-CM | POA: Insufficient documentation

## 2014-07-22 DIAGNOSIS — Z880 Allergy status to penicillin: Secondary | ICD-10-CM | POA: Insufficient documentation

## 2014-07-22 DIAGNOSIS — Z79899 Other long term (current) drug therapy: Secondary | ICD-10-CM | POA: Diagnosis not present

## 2014-07-22 MED ORDER — METOCLOPRAMIDE HCL 5 MG/ML IJ SOLN
10.0000 mg | Freq: Once | INTRAMUSCULAR | Status: AC
  Administered 2014-07-22: 10 mg via INTRAMUSCULAR
  Filled 2014-07-22: qty 2

## 2014-07-22 MED ORDER — DIPHENHYDRAMINE HCL 25 MG PO CAPS
25.0000 mg | ORAL_CAPSULE | Freq: Once | ORAL | Status: DC
  Filled 2014-07-22: qty 1

## 2014-07-22 MED ORDER — GI COCKTAIL ~~LOC~~
30.0000 mL | Freq: Once | ORAL | Status: AC
  Administered 2014-07-22: 30 mL via ORAL
  Filled 2014-07-22: qty 30

## 2014-07-22 MED ORDER — OMEPRAZOLE 20 MG PO CPDR: 20.0000 mg | DELAYED_RELEASE_CAPSULE | Freq: Every day | ORAL | Status: AC

## 2014-07-22 MED ORDER — METHOCARBAMOL 500 MG PO TABS
1000.0000 mg | ORAL_TABLET | Freq: Once | ORAL | Status: AC
  Administered 2014-07-22: 1000 mg via ORAL
  Filled 2014-07-22: qty 2

## 2014-07-22 MED ORDER — DIPHENHYDRAMINE HCL 25 MG PO CAPS: 50.0000 mg | ORAL_CAPSULE | Freq: Once | ORAL | Status: DC

## 2014-07-22 MED ORDER — DICYCLOMINE HCL 10 MG/ML IM SOLN
20.0000 mg | Freq: Once | INTRAMUSCULAR | Status: AC
  Administered 2014-07-22: 20 mg via INTRAMUSCULAR

## 2014-07-22 MED ORDER — DICYCLOMINE HCL 10 MG/ML IM SOLN
INTRAMUSCULAR | Status: AC
  Administered 2014-07-22: 20 mg via INTRAMUSCULAR
  Filled 2014-07-22: qty 2

## 2014-07-22 NOTE — ED Provider Notes (Signed)
CSN: [id number]     Arrival date & time 07/22/14  0303 History   First MD Initiated Contact with Patient 07/22/14 0359     Chief Complaint  Patient presents with  . Abdominal Pain     (Consider location/radiation/quality/duration/timing/severity/associated sxs/prior Treatment) Patient is a 38 y.o. female presenting with abdominal pain. The history is provided by the patient.  Abdominal Pain Pain location:  Generalized Pain quality: cramping   Pain radiates to:  Does not radiate Timing:  Constant Progression:  Unchanged Chronicity:  New Context: eating   Context: not alcohol use   Context comment:  Milk Relieved by:  Nothing Worsened by:  Nothing tried Ineffective treatments:  None tried Associated symptoms: no anorexia   Risk factors: no alcohol abuse     Past Medical History  Diagnosis Date  . GERD (gastroesophageal reflux disease)   . Thyroid disease   . Insomnia   . Bipolar 1 disorder   . Morbid obesity   . IBS (irritable bowel syndrome)   . Whiplash    Past Surgical History  Procedure Laterality Date  . Cholecystectomy    . Tympanostomy tube placement     History reviewed. No pertinent family history. History  Substance Use Topics  . Smoking status: Never Smoker   . Smokeless tobacco: Not on file  . Alcohol Use: No   OB History   Grav Para Term Preterm Abortions TAB SAB Ect Mult Living                 Review of Systems  Gastrointestinal: Positive for abdominal pain. Negative for anorexia.  All other systems reviewed and are negative.     Allergies  Amoxapine and related; Amoxicillin; Aspartame and phenylalanine; Dilaudid; Doxycycline; Flagyl; Keflex; Keppra; Neurontin; Provigil; Topamax; and Toradol  Home Medications   Prior to Admission medications   Medication Sig Start Date End Date Taking? Authorizing Provider  clidinium-chlordiazePOXIDE (LIBRAX) 5-2.5 MG per capsule Take 1 capsule by mouth.    Historical Provider, MD  clonazePAM  (KLONOPIN) 0.5 MG tablet Take 0.5 mg by mouth 3 (three) times daily.  at lunchtime only    Historical Provider, MD  divalproex (DEPAKOTE) 500 MG DR tablet Take 500 mg by mouth 3 (three) times daily.    Historical Provider, MD  eszopiclone (LUNESTA) 2 MG TABS tablet Take 3 mg by mouth at bedtime as needed for sleep. Take immediately before bedtime    Historical Provider, MD  furosemide (LASIX) 20 MG tablet Take 2 tablets (40 mg total) by mouth daily. 05/03/14   Shon Baton, MD  haloperidol (HALDOL) 5 MG tablet Take 5 mg by mouth 2 (two) times daily.    Historical Provider, MD  hydrOXYzine (ATARAX/VISTARIL) 25 MG tablet Take 1 tablet (25 mg total) by mouth every 4 (four) hours as needed for itching. 05/11/14   John L Molpus, MD  levothyroxine (SYNTHROID, LEVOTHROID) 75 MCG tablet Take 88 mcg by mouth daily before breakfast.     Historical Provider, MD  lithium 300 MG tablet Take 450 mg by mouth at bedtime.     Historical Provider, MD  loratadine (CLARITIN) 10 MG tablet Take 10 mg by mouth daily.    Historical Provider, MD  Milnacipran (SAVELLA) 50 MG TABS tablet Take 50 mg by mouth 2 (two) times daily.    Historical Provider, MD  montelukast (SINGULAIR) 10 MG tablet Take 10 mg by mouth at bedtime.    Historical Provider, MD  omeprazole (PRILOSEC) 20 MG capsule Take 1  capsule (20 mg total) by mouth daily. 07/22/14   Keliah Harned K Darus Hershman-Rasch, MD  orphenadrine (NORFLEX) 100 MG tablet Take 1 tablet (100 mg total) by mouth 2 (two) times daily. 05/18/14   Enid Skeens, MD  oxyCODONE-acetaminophen (PERCOCET) 5-325 MG per tablet Take 1-2 tablets by mouth every 6 (six) hours as needed (for pain). 05/11/14   John L Molpus, MD  pindolol (VISKEN) 5 MG tablet Take 5 mg by mouth 2 (two) times daily.    Historical Provider, MD  Potassium Chloride CR (MICRO-K) 8 MEQ CPCR capsule CR Take 8 mEq by mouth.    Historical Provider, MD  primidone (MYSOLINE) 50 MG tablet Take by mouth 4 (four) times daily.    Historical  Provider, MD  ramelteon (ROZEREM) 8 MG tablet Take 8 mg by mouth at bedtime.    Historical Provider, MD  ranitidine (ZANTAC) 300 MG tablet Take 300 mg by mouth at bedtime.    Historical Provider, MD  risperidone (RISPERDAL) 4 MG tablet Take 4 mg by mouth 2 (two) times daily.    Historical Provider, MD  temazepam (RESTORIL) 30 MG capsule Take 45 mg by mouth at bedtime as needed for sleep.    Historical Provider, MD   BP 148/78  Pulse 71  Temp(Src) 98.3 F (36.8 C) (Oral)  Resp 18  SpO2 95% Physical Exam  Constitutional: She is oriented to person, place, and time. She appears well-developed and well-nourished. No distress.  HENT:  Head: Normocephalic and atraumatic.  Mouth/Throat: Oropharynx is clear and moist.  Eyes: Conjunctivae are normal. Pupils are equal, round, and reactive to light.  Neck: Normal range of motion. Neck supple.  Cardiovascular: Normal rate, regular rhythm and intact distal pulses.   Pulmonary/Chest: Effort normal and breath sounds normal. She has no wheezes. She has no rales.  Abdominal: Soft. Bowel sounds are increased. There is no tenderness. There is no rebound and no guarding.  Musculoskeletal: Normal range of motion.  Neurological: She is alert and oriented to person, place, and time.  Skin: Skin is warm and dry.  Psychiatric: She has a normal mood and affect.    ED Course  Procedures (including critical care time) Labs Review Labs Reviewed - No data to display  Imaging Review No results found.   EKG Interpretation None      MDM   Final diagnoses:  Abdominal cramping    Lactose intolerance stay away from milk products.      Jasmine Awe, MD 07/22/14 [phone]

## 2014-07-22 NOTE — ED Notes (Signed)
Pt c/o abd pain that awoke her from sleep believes it was caused by milk she drank last PM

## 2014-08-02 ENCOUNTER — Encounter (HOSPITAL_BASED_OUTPATIENT_CLINIC_OR_DEPARTMENT_OTHER): Payer: Self-pay | Admitting: Emergency Medicine

## 2014-08-02 ENCOUNTER — Emergency Department (HOSPITAL_BASED_OUTPATIENT_CLINIC_OR_DEPARTMENT_OTHER)
Admission: EM | Admit: 2014-08-02 | Discharge: 2014-08-03 | Disposition: A | Discharge status: Home / Self Care | Payer: No Typology Code available for payment source | Attending: Emergency Medicine | Admitting: Emergency Medicine

## 2014-08-02 DIAGNOSIS — K219 Gastro-esophageal reflux disease without esophagitis: Secondary | ICD-10-CM | POA: Insufficient documentation

## 2014-08-02 DIAGNOSIS — Z79899 Other long term (current) drug therapy: Secondary | ICD-10-CM | POA: Insufficient documentation

## 2014-08-02 DIAGNOSIS — R51 Headache: Secondary | ICD-10-CM | POA: Insufficient documentation

## 2014-08-02 DIAGNOSIS — Z880 Allergy status to penicillin: Secondary | ICD-10-CM | POA: Diagnosis not present

## 2014-08-02 DIAGNOSIS — G4700 Insomnia, unspecified: Secondary | ICD-10-CM | POA: Insufficient documentation

## 2014-08-02 DIAGNOSIS — E079 Disorder of thyroid, unspecified: Secondary | ICD-10-CM | POA: Insufficient documentation

## 2014-08-02 DIAGNOSIS — G4489 Other headache syndrome: Secondary | ICD-10-CM

## 2014-08-02 DIAGNOSIS — F319 Bipolar disorder, unspecified: Secondary | ICD-10-CM | POA: Insufficient documentation

## 2014-08-02 NOTE — ED Notes (Signed)
Pt. Reports she has a headache and has history of migraines.  Pt. In no distress.  Pt. Drove self to ED.

## 2014-08-03 NOTE — ED Notes (Signed)
MD at bedside. 

## 2014-08-03 NOTE — Discharge Instructions (Signed)

## 2014-08-03 NOTE — ED Provider Notes (Signed)
CSN: [id number]     Arrival date & time 08/02/14  2258 History   First MD Initiated Contact with Patient 08/03/14 0007     Chief Complaint  Patient presents with  . Headache      Patient is a 38 y.o. female presenting with headaches. The history is provided by the patient.  Headache Pain location:  Generalized Onset quality:  Gradual Duration:  3 hours Timing:  Constant Progression:  Improving Chronicity:  Recurrent Worsened by:  Nothing tried Associated symptoms: no fever, no focal weakness, no visual change, no vomiting and no weakness   Pt reports onset of headache about 3 hrs prior to my evaluation Similar to prior headaches Her home meds did not improve her headache No fever/vomiting No focal weakness No slurred speech reported No visual loss No head trauma reported No rash/tick bites reported She denies h/o CVA  Past Medical History  Diagnosis Date  . GERD (gastroesophageal reflux disease)   . Thyroid disease   . Insomnia   . Bipolar 1 disorder   . Morbid obesity   . IBS (irritable bowel syndrome)   . Whiplash    Past Surgical History  Procedure Laterality Date  . Cholecystectomy    . Tympanostomy tube placement     No family history on file. History  Substance Use Topics  . Smoking status: Never Smoker   . Smokeless tobacco: Not on file  . Alcohol Use: No   OB History   Grav Para Term Preterm Abortions TAB SAB Ect Mult Living                 Review of Systems  Constitutional: Negative for fever.  Gastrointestinal: Negative for vomiting.  Neurological: Positive for headaches. Negative for focal weakness and weakness.  All other systems reviewed and are negative.     Allergies  Amoxapine and related; Amoxicillin; Aspartame and phenylalanine; Dilaudid; Doxycycline; Flagyl; Keflex; Keppra; Neurontin; Provigil; Topamax; and Toradol  Home Medications   Prior to Admission medications   Medication Sig Start Date End Date Taking? Authorizing  Provider  clidinium-chlordiazePOXIDE (LIBRAX) 5-2.5 MG per capsule Take 1 capsule by mouth.    Historical Provider, MD  clonazePAM (KLONOPIN) 0.5 MG tablet Take 0.5 mg by mouth 3 (three) times daily.  at lunchtime only    Historical Provider, MD  divalproex (DEPAKOTE) 500 MG DR tablet Take 500 mg by mouth 3 (three) times daily.    Historical Provider, MD  eszopiclone (LUNESTA) 2 MG TABS tablet Take 3 mg by mouth at bedtime as needed for sleep. Take immediately before bedtime    Historical Provider, MD  furosemide (LASIX) 20 MG tablet Take 2 tablets (40 mg total) by mouth daily. 05/03/14   Shon Baton, MD  haloperidol (HALDOL) 5 MG tablet Take 5 mg by mouth 2 (two) times daily.    Historical Provider, MD  hydrOXYzine (ATARAX/VISTARIL) 25 MG tablet Take 1 tablet (25 mg total) by mouth every 4 (four) hours as needed for itching. 05/11/14   John L Molpus, MD  levothyroxine (SYNTHROID, LEVOTHROID) 75 MCG tablet Take 88 mcg by mouth daily before breakfast.     Historical Provider, MD  lithium 300 MG tablet Take 450 mg by mouth at bedtime.     Historical Provider, MD  loratadine (CLARITIN) 10 MG tablet Take 10 mg by mouth daily.    Historical Provider, MD  Milnacipran (SAVELLA) 50 MG TABS tablet Take 50 mg by mouth 2 (two) times daily.    Historical  Provider, MD  montelukast (SINGULAIR) 10 MG tablet Take 10 mg by mouth at bedtime.    Historical Provider, MD  omeprazole (PRILOSEC) 20 MG capsule Take 1 capsule (20 mg total) by mouth daily. 07/22/14   April K Palumbo-Rasch, MD  orphenadrine (NORFLEX) 100 MG tablet Take 1 tablet (100 mg total) by mouth 2 (two) times daily. 05/18/14   Enid Skeens, MD  oxyCODONE-acetaminophen (PERCOCET) 5-325 MG per tablet Take 1-2 tablets by mouth every 6 (six) hours as needed (for pain). 05/11/14   John L Molpus, MD  pindolol (VISKEN) 5 MG tablet Take 5 mg by mouth 2 (two) times daily.    Historical Provider, MD  Potassium Chloride CR (MICRO-K) 8 MEQ CPCR capsule CR  Take 8 mEq by mouth.    Historical Provider, MD  primidone (MYSOLINE) 50 MG tablet Take by mouth 4 (four) times daily.    Historical Provider, MD  ramelteon (ROZEREM) 8 MG tablet Take 8 mg by mouth at bedtime.    Historical Provider, MD  ranitidine (ZANTAC) 300 MG tablet Take 300 mg by mouth at bedtime.    Historical Provider, MD  risperidone (RISPERDAL) 4 MG tablet Take 4 mg by mouth 2 (two) times daily.    Historical Provider, MD  temazepam (RESTORIL) 30 MG capsule Take 45 mg by mouth at bedtime as needed for sleep.    Historical Provider, MD   BP 129/97  Pulse 104  Temp(Src) 98.7 F (37.1 C) (Oral)  Resp 20  Ht  (1.575 m)  Wt 270 lb (122.471 kg)  BMI 49.37 kg/m2  SpO2 95% Physical Exam CONSTITUTIONAL: Well developed/well nourished HEAD: Normocephalic/atraumatic EYES: EOMI/PERRL, no nystagmus, no ptosis, normal fundoscopic exam (no papilledema)  ENMT: Mucous membranes moist NECK: supple no meningeal signs, no bruits SPINE:entire spine nontender CV: S1/S2 noted, no murmurs/rubs/gallops noted LUNGS: Lungs are clear to auscultation bilaterally, no apparent distress ABDOMEN: soft, nontender, no rebound or guarding GU:no cva tenderness NEURO:Awake/alert, facies symmetric, no arm or leg drift is noted Equal 5/5 strength with shoulder abduction, elbow flex/extension, wrist flex/extension in upper extremities and equal hand grips bilaterally Equal 5/5 strength with hip flexion,knee flex/extension, foot dorsi/plantar flexion Cranial nerves 3/4/5/6/06/07/09/11/12 tested and intact Gait normal without ataxia No past pointing Sensation to light touch intact in all extremities EXTREMITIES: pulses normal, full ROM SKIN: warm, color normal PSYCH: no abnormalities of mood noted   ED Course  Procedures   When I walked in room pt reports she was about to leave as her HA was improving She is well appearing, no distress, ambulatory, no focal neuro deficits Emergent workup not indicated  at this time She is requesting multiple medications which will likely sedate her and she is driving home I advised her to call her PCP in the morning for followup  MDM   Final diagnoses:  Other headache syndrome    Nursing notes including past medical history and social history reviewed and considered in documentation Previous records reviewed and considered     Joya Gaskins, MD 08/03/14 [phone]

## 2014-08-13 ENCOUNTER — Emergency Department (HOSPITAL_BASED_OUTPATIENT_CLINIC_OR_DEPARTMENT_OTHER)
Admission: EM | Admit: 2014-08-13 | Discharge: 2014-08-14 | Disposition: A | Discharge status: Home / Self Care | Payer: No Typology Code available for payment source | Attending: Emergency Medicine | Admitting: Emergency Medicine

## 2014-08-13 ENCOUNTER — Encounter (HOSPITAL_BASED_OUTPATIENT_CLINIC_OR_DEPARTMENT_OTHER): Payer: Self-pay | Admitting: Emergency Medicine

## 2014-08-13 DIAGNOSIS — Z87828 Personal history of other (healed) physical injury and trauma: Secondary | ICD-10-CM | POA: Insufficient documentation

## 2014-08-13 DIAGNOSIS — K219 Gastro-esophageal reflux disease without esophagitis: Secondary | ICD-10-CM | POA: Insufficient documentation

## 2014-08-13 DIAGNOSIS — E079 Disorder of thyroid, unspecified: Secondary | ICD-10-CM | POA: Insufficient documentation

## 2014-08-13 DIAGNOSIS — G43909 Migraine, unspecified, not intractable, without status migrainosus: Secondary | ICD-10-CM

## 2014-08-13 DIAGNOSIS — Z3202 Encounter for pregnancy test, result negative: Secondary | ICD-10-CM | POA: Insufficient documentation

## 2014-08-13 DIAGNOSIS — Z880 Allergy status to penicillin: Secondary | ICD-10-CM | POA: Diagnosis not present

## 2014-08-13 DIAGNOSIS — F319 Bipolar disorder, unspecified: Secondary | ICD-10-CM | POA: Insufficient documentation

## 2014-08-13 DIAGNOSIS — Z79899 Other long term (current) drug therapy: Secondary | ICD-10-CM | POA: Diagnosis not present

## 2014-08-13 NOTE — ED Notes (Signed)
Migraine headache. Drove herself here. 

## 2014-08-13 NOTE — ED Notes (Addendum)
Pt drove herself here, but her father is now at her bedside to provide transportation home if needed

## 2014-08-14 ENCOUNTER — Encounter (HOSPITAL_BASED_OUTPATIENT_CLINIC_OR_DEPARTMENT_OTHER): Payer: Self-pay | Admitting: Emergency Medicine

## 2014-08-14 LAB — PREGNANCY, URINE: Preg Test, Ur: NEGATIVE

## 2014-08-14 MED ORDER — PROMETHAZINE HCL 25 MG/ML IJ SOLN
12.5000 mg | Freq: Once | INTRAMUSCULAR | Status: AC
  Administered 2014-08-14: 12.5 mg via INTRAMUSCULAR
  Filled 2014-08-14: qty 1

## 2014-08-14 MED ORDER — METHOCARBAMOL 1000 MG/10ML IJ SOLN
1000.0000 mg | Freq: Once | INTRAMUSCULAR | Status: AC
  Administered 2014-08-14: 1000 mg via INTRAMUSCULAR
  Filled 2014-08-14: qty 10

## 2014-08-14 MED ORDER — METOCLOPRAMIDE HCL 5 MG/ML IJ SOLN
10.0000 mg | Freq: Once | INTRAMUSCULAR | Status: AC
  Administered 2014-08-14: 10 mg via INTRAMUSCULAR
  Filled 2014-08-14: qty 2

## 2014-08-14 MED ORDER — HYDROXYZINE HCL 10 MG PO TABS
10.0000 mg | ORAL_TABLET | Freq: Once | ORAL | Status: AC
  Filled 2014-08-14: qty 1

## 2014-08-14 MED ORDER — HYDROXYZINE HCL 10 MG/5ML PO SYRP
ORAL_SOLUTION | ORAL | Status: AC
  Administered 2014-08-14: 10 mg
  Filled 2014-08-14: qty 1

## 2014-08-14 NOTE — ED Notes (Signed)
Pt. Reports she took advil today and advil tonight.  Pt. Reports the advil worked today and it didn't work [employer].  Pt. Reports she didn't take her migrane  Medicine tonight because it is too expensive to use and she does not want to waste it so she would rather come to the ED.
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
## 2014-08-14 NOTE — ED Notes (Signed)
MD at bedside. 

## 2014-08-14 NOTE — Discharge Instructions (Signed)

## 2014-08-17 ENCOUNTER — Emergency Department (HOSPITAL_BASED_OUTPATIENT_CLINIC_OR_DEPARTMENT_OTHER)
Admission: EM | Admit: 2014-08-17 | Discharge: 2014-08-17 | Disposition: A | Discharge status: Home / Self Care | Payer: No Typology Code available for payment source | Attending: Emergency Medicine | Admitting: Emergency Medicine

## 2014-08-17 ENCOUNTER — Encounter (HOSPITAL_BASED_OUTPATIENT_CLINIC_OR_DEPARTMENT_OTHER): Payer: Self-pay | Admitting: Emergency Medicine

## 2014-08-17 DIAGNOSIS — E079 Disorder of thyroid, unspecified: Secondary | ICD-10-CM | POA: Diagnosis not present

## 2014-08-17 DIAGNOSIS — R21 Rash and other nonspecific skin eruption: Secondary | ICD-10-CM | POA: Diagnosis present

## 2014-08-17 DIAGNOSIS — B356 Tinea cruris: Secondary | ICD-10-CM | POA: Diagnosis not present

## 2014-08-17 DIAGNOSIS — F319 Bipolar disorder, unspecified: Secondary | ICD-10-CM | POA: Insufficient documentation

## 2014-08-17 DIAGNOSIS — Z79899 Other long term (current) drug therapy: Secondary | ICD-10-CM | POA: Insufficient documentation

## 2014-08-17 DIAGNOSIS — Z880 Allergy status to penicillin: Secondary | ICD-10-CM | POA: Insufficient documentation

## 2014-08-17 DIAGNOSIS — K219 Gastro-esophageal reflux disease without esophagitis: Secondary | ICD-10-CM | POA: Insufficient documentation

## 2014-08-17 DIAGNOSIS — K589 Irritable bowel syndrome without diarrhea: Secondary | ICD-10-CM | POA: Diagnosis not present

## 2014-08-17 DIAGNOSIS — Z87828 Personal history of other (healed) physical injury and trauma: Secondary | ICD-10-CM | POA: Insufficient documentation

## 2014-08-17 LAB — CBG MONITORING, ED: GLUCOSE-CAPILLARY: 125 mg/dL — AB (ref 70–99)

## 2014-08-17 MED ORDER — TERBINAFINE HCL 1 % EX CREA: TOPICAL_CREAM | CUTANEOUS | Status: DC

## 2014-08-17 NOTE — ED Notes (Signed)
MD at bedside. 

## 2014-08-17 NOTE — ED Notes (Signed)
Pt c/o rash to upper thighs seen by PMD today DX yeast, pt requesting pain med "primary doctor wont give me any"
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
## 2014-08-17 NOTE — ED Notes (Signed)
Pt states she was seen by MD Tuesday-cultures were done-she was started on an ointment that day-was called today and dx with yeast and started on oral med-no pain meds-pt here due to pain

## 2014-08-23 ENCOUNTER — Encounter (HOSPITAL_BASED_OUTPATIENT_CLINIC_OR_DEPARTMENT_OTHER): Payer: Self-pay | Admitting: Emergency Medicine

## 2014-08-23 ENCOUNTER — Emergency Department (HOSPITAL_BASED_OUTPATIENT_CLINIC_OR_DEPARTMENT_OTHER)
Admission: EM | Admit: 2014-08-23 | Discharge: 2014-08-24 | Disposition: A | Discharge status: Home / Self Care | Payer: No Typology Code available for payment source | Attending: Emergency Medicine | Admitting: Emergency Medicine

## 2014-08-23 DIAGNOSIS — Z87828 Personal history of other (healed) physical injury and trauma: Secondary | ICD-10-CM | POA: Insufficient documentation

## 2014-08-23 DIAGNOSIS — M542 Cervicalgia: Secondary | ICD-10-CM | POA: Diagnosis present

## 2014-08-23 DIAGNOSIS — Z79899 Other long term (current) drug therapy: Secondary | ICD-10-CM | POA: Diagnosis not present

## 2014-08-23 DIAGNOSIS — K589 Irritable bowel syndrome without diarrhea: Secondary | ICD-10-CM | POA: Diagnosis not present

## 2014-08-23 DIAGNOSIS — E079 Disorder of thyroid, unspecified: Secondary | ICD-10-CM | POA: Diagnosis not present

## 2014-08-23 DIAGNOSIS — Z880 Allergy status to penicillin: Secondary | ICD-10-CM | POA: Insufficient documentation

## 2014-08-23 DIAGNOSIS — F319 Bipolar disorder, unspecified: Secondary | ICD-10-CM | POA: Insufficient documentation

## 2014-08-23 DIAGNOSIS — G4700 Insomnia, unspecified: Secondary | ICD-10-CM | POA: Diagnosis not present

## 2014-08-23 DIAGNOSIS — K219 Gastro-esophageal reflux disease without esophagitis: Secondary | ICD-10-CM | POA: Diagnosis not present

## 2014-08-23 MED ORDER — METHOCARBAMOL 1000 MG/10ML IJ SOLN
1000.0000 mg | Freq: Once | INTRAMUSCULAR | Status: AC
  Administered 2014-08-23: 1000 mg via INTRAMUSCULAR
  Filled 2014-08-23: qty 10

## 2014-08-23 MED ORDER — PROMETHAZINE HCL 25 MG/ML IJ SOLN
12.5000 mg | Freq: Once | INTRAMUSCULAR | Status: AC
  Administered 2014-08-23: 12.5 mg via INTRAMUSCULAR
  Filled 2014-08-23: qty 1

## 2014-08-23 MED ORDER — METOCLOPRAMIDE HCL 5 MG/ML IJ SOLN
10.0000 mg | Freq: Once | INTRAMUSCULAR | Status: AC
  Administered 2014-08-23: 10 mg via INTRAVENOUS
  Filled 2014-08-23: qty 2

## 2014-08-23 MED ORDER — HYDROXYZINE HCL 10 MG/5ML PO SYRP
10.0000 mg | ORAL_SOLUTION | Freq: Once | ORAL | Status: AC
  Administered 2014-08-23: 10 mg via ORAL
  Filled 2014-08-23: qty 1
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
## 2014-08-23 NOTE — ED Notes (Signed)
Pt. Reports the medicine she received the last time she was here worked for there neck ache and she has the same neck ache again tonight.
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
## 2014-09-03 ENCOUNTER — Encounter (HOSPITAL_BASED_OUTPATIENT_CLINIC_OR_DEPARTMENT_OTHER): Payer: Self-pay | Admitting: Emergency Medicine

## 2014-09-03 DIAGNOSIS — R110 Nausea: Secondary | ICD-10-CM | POA: Insufficient documentation

## 2014-09-03 DIAGNOSIS — Z3202 Encounter for pregnancy test, result negative: Secondary | ICD-10-CM | POA: Insufficient documentation

## 2014-09-03 DIAGNOSIS — Z880 Allergy status to penicillin: Secondary | ICD-10-CM | POA: Diagnosis not present

## 2014-09-03 DIAGNOSIS — Z9089 Acquired absence of other organs: Secondary | ICD-10-CM | POA: Insufficient documentation

## 2014-09-03 DIAGNOSIS — Z87828 Personal history of other (healed) physical injury and trauma: Secondary | ICD-10-CM | POA: Insufficient documentation

## 2014-09-03 DIAGNOSIS — Z79899 Other long term (current) drug therapy: Secondary | ICD-10-CM | POA: Diagnosis not present

## 2014-09-03 DIAGNOSIS — R141 Gas pain: Secondary | ICD-10-CM | POA: Diagnosis not present

## 2014-09-03 DIAGNOSIS — F319 Bipolar disorder, unspecified: Secondary | ICD-10-CM | POA: Insufficient documentation

## 2014-09-03 DIAGNOSIS — K219 Gastro-esophageal reflux disease without esophagitis: Secondary | ICD-10-CM | POA: Diagnosis not present

## 2014-09-03 DIAGNOSIS — N390 Urinary tract infection, site not specified: Secondary | ICD-10-CM | POA: Insufficient documentation

## 2014-09-03 DIAGNOSIS — R100 Acute abdomen: Secondary | ICD-10-CM | POA: Diagnosis present

## 2014-09-03 DIAGNOSIS — E079 Disorder of thyroid, unspecified: Secondary | ICD-10-CM | POA: Insufficient documentation

## 2014-09-03 NOTE — ED Notes (Signed)
Pt c/o diffuse abd pain with nausea x 1 day

## 2014-09-04 ENCOUNTER — Emergency Department (HOSPITAL_BASED_OUTPATIENT_CLINIC_OR_DEPARTMENT_OTHER): Payer: No Typology Code available for payment source

## 2014-09-04 ENCOUNTER — Encounter (HOSPITAL_BASED_OUTPATIENT_CLINIC_OR_DEPARTMENT_OTHER): Payer: Self-pay | Admitting: Emergency Medicine

## 2014-09-04 ENCOUNTER — Emergency Department (HOSPITAL_BASED_OUTPATIENT_CLINIC_OR_DEPARTMENT_OTHER)
Admission: EM | Admit: 2014-09-04 | Discharge: 2014-09-04 | Disposition: A | Discharge status: Home / Self Care | Payer: No Typology Code available for payment source | Attending: Emergency Medicine | Admitting: Emergency Medicine

## 2014-09-04 DIAGNOSIS — IMO0001 Reserved for inherently not codable concepts without codable children: Secondary | ICD-10-CM

## 2014-09-04 DIAGNOSIS — N390 Urinary tract infection, site not specified: Secondary | ICD-10-CM

## 2014-09-04 DIAGNOSIS — R109 Unspecified abdominal pain: Secondary | ICD-10-CM

## 2014-09-04 LAB — URINALYSIS, ROUTINE W REFLEX MICROSCOPIC
Bilirubin Urine: NEGATIVE
Glucose, UA: NEGATIVE mg/dL
Ketones, ur: NEGATIVE mg/dL
NITRITE: NEGATIVE
PH: 6 (ref 5.0–8.0)
Protein, ur: NEGATIVE mg/dL
SPECIFIC GRAVITY, URINE: 1.006 (ref 1.005–1.030)
Urobilinogen, UA: 0.2 mg/dL (ref 0.0–1.0)

## 2014-09-04 LAB — URINE MICROSCOPIC-ADD ON

## 2014-09-04 LAB — PREGNANCY, URINE: Preg Test, Ur: NEGATIVE

## 2014-09-04 MED ORDER — GI COCKTAIL ~~LOC~~
30.0000 mL | Freq: Once | ORAL | Status: AC
  Administered 2014-09-04: 30 mL via ORAL
  Filled 2014-09-04: qty 30

## 2014-09-04 MED ORDER — DIPHENHYDRAMINE HCL 50 MG/ML IJ SOLN
12.5000 mg | Freq: Once | INTRAMUSCULAR | Status: DC
  Filled 2014-09-04: qty 1

## 2014-09-04 MED ORDER — NITROFURANTOIN MONOHYD MACRO 100 MG PO CAPS
100.0000 mg | ORAL_CAPSULE | Freq: Once | ORAL | Status: AC
  Administered 2014-09-04: 100 mg via ORAL
  Filled 2014-09-04: qty 1

## 2014-09-04 MED ORDER — METOCLOPRAMIDE HCL 5 MG/ML IJ SOLN
10.0000 mg | Freq: Once | INTRAMUSCULAR | Status: AC
  Administered 2014-09-04: 10 mg via INTRAMUSCULAR
  Filled 2014-09-04: qty 2

## 2014-09-04 MED ORDER — DICYCLOMINE HCL 20 MG PO TABS: 20.0000 mg | ORAL_TABLET | Freq: Two times a day (BID) | ORAL | Status: DC

## 2014-09-04 MED ORDER — NITROFURANTOIN MONOHYD MACRO 100 MG PO CAPS: 100.0000 mg | ORAL_CAPSULE | Freq: Two times a day (BID) | ORAL | Status: DC

## 2014-09-04 MED ORDER — DICYCLOMINE HCL 10 MG/ML IM SOLN
20.0000 mg | Freq: Once | INTRAMUSCULAR | Status: AC
  Administered 2014-09-04: 20 mg via INTRAMUSCULAR
  Filled 2014-09-04: qty 2

## 2014-09-04 MED ORDER — PHENAZOPYRIDINE HCL 200 MG PO TABS: 200.0000 mg | ORAL_TABLET | Freq: Three times a day (TID) | ORAL | Status: DC

## 2014-09-04 NOTE — ED Provider Notes (Signed)
CSN: [id number]     Arrival date & time 09/03/14  2337 History  This chart was scribed for Lenox Bink Smitty CordsK James Senn-Rasch, MD by Richarda Overlieichard Holland, ED Scribe. This patient was seen in room MH02/MH02 and the patient's care was started 12:43 AM.    Chief Complaint  Patient presents with  . Abdominal Pain    Patient is a 38 y.o. female presenting with cramps. The history is provided by the patient. No language interpreter was used.  Abdominal Cramping Pain location:  Generalized Pain quality: cramping   Pain radiates to:  Does not radiate Pain severity:  Severe Onset quality:  Gradual Timing:  Constant Progression:  Unchanged Chronicity:  Recurrent Context: eating   Relieved by:  Nothing Worsened by:  Nothing tried Ineffective treatments:  None tried Associated symptoms: nausea   Associated symptoms: no chest pain, no dysuria, no flatus, no hematemesis, no vaginal bleeding and no vaginal discharge    HPI Comments: Jessica MohsHolly Nicholson is a 38 y.o. female with a history of GERD who presents to the Emergency Department complaining of diffuse abdominal pain that started at 9PM. She reports she ate leftover frozen pasta for dinner, a grilled cheese for lunch and a protein bar for breakfast. She reports her last BM was at 9:30PM and was normal but somewhat loose. She reports taking gas-x but states it failed to provide relief to her symptoms. She reports no modifying factors or anything that improves or worsens her current symptoms.    Past Medical History  Diagnosis Date  . GERD (gastroesophageal reflux disease)   . Thyroid disease   . Insomnia   . Bipolar 1 disorder   . Morbid obesity   . IBS (irritable bowel syndrome)   . Whiplash    Past Surgical History  Procedure Laterality Date  . Cholecystectomy    . Tympanostomy tube placement     History reviewed. No pertinent family history. History  Substance Use Topics  . Smoking status: Never Smoker   . Smokeless tobacco: Not on file  . Alcohol  Use: No   OB History   Grav Para Term Preterm Abortions TAB SAB Ect Mult Living                 Review of Systems  Cardiovascular: Negative for chest pain and leg swelling.  Gastrointestinal: Positive for nausea and abdominal pain. Negative for flatus and hematemesis.  Genitourinary: Negative for dysuria, vaginal bleeding and vaginal discharge.  All other systems reviewed and are negative.    Allergies  Amoxapine and related; Amoxicillin; Aspartame and phenylalanine; Dilaudid; Doxycycline; Flagyl; Keflex; Keppra; Neurontin; Provigil; Topamax; and Toradol  Home Medications   Prior to Admission medications   Medication Sig Start Date End Date Taking? Authorizing Provider  clidinium-chlordiazePOXIDE (LIBRAX) 5-2.5 MG per capsule Take 1 capsule by mouth.    Historical Provider, MD  clonazePAM (KLONOPIN) 0.5 MG tablet Take 0.5 mg by mouth 3 (three) times daily. 1mg  at lunchtime only    Historical Provider, MD  divalproex (DEPAKOTE) 500 MG DR tablet Take 500 mg by mouth 3 (three) times daily.    Historical Provider, MD  eszopiclone (LUNESTA) 2 MG TABS tablet Take 3 mg by mouth at bedtime as needed for sleep. Take immediately before bedtime    Historical Provider, MD  fluconazole (DIFLUCAN) 150 MG tablet Take 150 mg by mouth daily.    Historical Provider, MD  furosemide (LASIX) 20 MG tablet Take 2 tablets (40 mg total) by mouth daily. 05/03/14   Toni Amendourtney  F Horton, MD  haloperidol (HALDOL) 5 MG tablet Take 5 mg by mouth 2 (two) times daily.    Historical Provider, MD  hydrOXYzine (ATARAX/VISTARIL) 25 MG tablet Take 1 tablet (25 mg total) by mouth every 4 (four) hours as needed for itching. 05/11/14   John L Molpus, MD  levothyroxine (SYNTHROID, LEVOTHROID) 75 MCG tablet Take 88 mcg by mouth daily before breakfast.     Historical Provider, MD  lithium 300 MG tablet Take 450 mg by mouth at bedtime.     Historical Provider, MD  loratadine (CLARITIN) 10 MG tablet Take 10 mg by mouth daily.     Historical Provider, MD  Milnacipran (SAVELLA) 50 MG TABS tablet Take 50 mg by mouth 2 (two) times daily.    Historical Provider, MD  montelukast (SINGULAIR) 10 MG tablet Take 10 mg by mouth at bedtime.    Historical Provider, MD  omeprazole (PRILOSEC) 20 MG capsule Take 1 capsule (20 mg total) by mouth daily. 07/22/14   Lechelle Wrigley K Ezekiah Massie-Rasch, MD  orphenadrine (NORFLEX) 100 MG tablet Take 1 tablet (100 mg total) by mouth 2 (two) times daily. 05/18/14   Enid Skeens, MD  oxyCODONE-acetaminophen (PERCOCET) 5-325 MG per tablet Take 1-2 tablets by mouth every 6 (six) hours as needed (for pain). 05/11/14   John L Molpus, MD  pindolol (VISKEN) 5 MG tablet Take 5 mg by mouth 2 (two) times daily.    Historical Provider, MD  Potassium Chloride CR (MICRO-K) 8 MEQ CPCR capsule CR Take 8 mEq by mouth.    Historical Provider, MD  primidone (MYSOLINE) 50 MG tablet Take by mouth 4 (four) times daily.    Historical Provider, MD  ramelteon (ROZEREM) 8 MG tablet Take 8 mg by mouth at bedtime.    Historical Provider, MD  ranitidine (ZANTAC) 300 MG tablet Take 300 mg by mouth at bedtime.    Historical Provider, MD  risperidone (RISPERDAL) 4 MG tablet Take 4 mg by mouth 2 (two) times daily.    Historical Provider, MD  temazepam (RESTORIL) 30 MG capsule Take 45 mg by mouth at bedtime as needed for sleep.    Historical Provider, MD  terbinafine (LAMISIL AT) 1 % cream Apply to rash twice daily. 08/17/14   John L Molpus, MD   BP 138/74  Pulse 100  Temp(Src) 98.3 F (36.8 C) (Oral)  Resp 18  Ht 5\' 2"  (1.575 m)  Wt 270 lb (122.471 kg)  BMI 49.37 kg/m2  SpO2 95%  Physical Exam  Nursing note and vitals reviewed. Constitutional: She is oriented to person, place, and time. She appears well-developed and well-nourished.  HENT:  Head: Normocephalic and atraumatic.  Mouth/Throat: Oropharynx is clear and moist.  Eyes: Conjunctivae and EOM are normal. Pupils are equal, round, and reactive to light.  Neck: Normal range of  motion. Neck supple.  Cardiovascular: Normal rate, regular rhythm, normal heart sounds and intact distal pulses.   Pulmonary/Chest: Effort normal and breath sounds normal. She has no wheezes. She has no rales.  Abdominal: Soft. She exhibits no distension and no mass. There is no tenderness. There is no rebound and no guarding.  Hyperactive bowel sounds  Musculoskeletal: Normal range of motion.  Neurological: She is alert and oriented to person, place, and time.  Skin: Skin is warm and dry.    ED Course  Procedures  DIAGNOSTIC STUDIES: Oxygen Saturation is 95% on RA, normal by my interpretation.    COORDINATION OF CARE: 12:49 AM Discussed treatment plan with pt  at bedside and pt agreed to plan.   Labs Review Labs Reviewed  PREGNANCY, URINE  URINALYSIS, ROUTINE W REFLEX MICROSCOPIC    Imaging Review No results found.   EKG Interpretation None      MDM   Final diagnoses:  None  patient is well known to me in the ED  Will treat for UTI.  Suspect this is actually just abdominal cramping due to gas.  Similar presentation to previous episodes.  Will treat.  Strict return precautions given   I personally performed the services described in this documentation, which was scribed in my presence. The recorded information has been reviewed and is accurate.    Jasmine Awe, MD 09/04/14 [phone]

## 2014-09-04 NOTE — ED Notes (Signed)
Provider at bedside for pt eval.

## 2014-09-26 ENCOUNTER — Emergency Department (HOSPITAL_BASED_OUTPATIENT_CLINIC_OR_DEPARTMENT_OTHER)
Admission: EM | Admit: 2014-09-26 | Discharge: 2014-09-26 | Disposition: A | Discharge status: Home / Self Care | Payer: No Typology Code available for payment source | Attending: Emergency Medicine | Admitting: Emergency Medicine

## 2014-09-26 ENCOUNTER — Encounter (HOSPITAL_BASED_OUTPATIENT_CLINIC_OR_DEPARTMENT_OTHER): Payer: Self-pay | Admitting: Emergency Medicine

## 2014-09-26 DIAGNOSIS — Z79899 Other long term (current) drug therapy: Secondary | ICD-10-CM | POA: Diagnosis not present

## 2014-09-26 DIAGNOSIS — Z880 Allergy status to penicillin: Secondary | ICD-10-CM | POA: Insufficient documentation

## 2014-09-26 DIAGNOSIS — E079 Disorder of thyroid, unspecified: Secondary | ICD-10-CM | POA: Diagnosis not present

## 2014-09-26 DIAGNOSIS — K219 Gastro-esophageal reflux disease without esophagitis: Secondary | ICD-10-CM | POA: Insufficient documentation

## 2014-09-26 DIAGNOSIS — F319 Bipolar disorder, unspecified: Secondary | ICD-10-CM | POA: Diagnosis not present

## 2014-09-26 DIAGNOSIS — R519 Headache, unspecified: Secondary | ICD-10-CM

## 2014-09-26 DIAGNOSIS — R51 Headache: Secondary | ICD-10-CM

## 2014-09-26 DIAGNOSIS — G43909 Migraine, unspecified, not intractable, without status migrainosus: Secondary | ICD-10-CM | POA: Diagnosis not present

## 2014-09-26 MED ORDER — METOCLOPRAMIDE HCL 5 MG/ML IJ SOLN
10.0000 mg | Freq: Once | INTRAMUSCULAR | Status: AC
  Administered 2014-09-26: 10 mg via INTRAMUSCULAR
  Filled 2014-09-26: qty 2

## 2014-09-26 MED ORDER — PROMETHAZINE HCL 25 MG/ML IJ SOLN
12.5000 mg | Freq: Once | INTRAMUSCULAR | Status: AC
  Administered 2014-09-26: 12.5 mg via INTRAMUSCULAR
  Filled 2014-09-26: qty 1

## 2014-09-26 MED ORDER — METHOCARBAMOL 1000 MG/10ML IJ SOLN
1000.0000 mg | Freq: Once | INTRAMUSCULAR | Status: AC
  Administered 2014-09-26: 1000 mg via INTRAMUSCULAR
  Filled 2014-09-26: qty 10

## 2014-09-26 MED ORDER — HYDROXYZINE HCL 10 MG/5ML PO SYRP
10.0000 mg | ORAL_SOLUTION | Freq: Once | ORAL | Status: AC
  Administered 2014-09-26: 10 mg via ORAL
  Filled 2014-09-26: qty 1

## 2014-09-26 NOTE — ED Provider Notes (Signed)
CSN: [id number]     Arrival date & time 09/26/14  1848 History  This chart was scribed for Jessica Foresterrey Tamarius Rosenfield, MD by Gwenyth Oberatherine Macek, ED Scribe. This patient was seen in room MH12/MH12 and the patient's care was started at 7:25 PM.   Chief Complaint  Patient presents with  . Migraine   Patient is a 38 y.o. female presenting with migraines. The history is provided by the patient. No language interpreter was used.  Migraine This is a recurrent problem. The current episode started 6 to 12 hours ago. The problem occurs every several days. The problem has been gradually worsening. Associated symptoms include headaches. Pertinent negatives include no chest pain, no abdominal pain and no shortness of breath. Nothing relieves the symptoms.   HPI Comments: Jessica Nicholson is a 38 y.o. female with a history of migraines and whiplash who presents to the Emergency Department complaining of a gradual onset, gradually worsening, 9/10 migraine that starts at the top of her head and radiates to her neck, spine and bilateral legs, arms and toes. Pt states symptoms started 9 hours ago. She notes nausea, photophobia, and a yellow, spotted aura as associated symptoms. Pt states that current symptoms are consistent with history of migraines and that she was last in the ED on 9/24 with similar symptoms. She denies fever, vomiting, diarrhea, congestion, CP, SOB, and urinary symptoms.   Past Medical History  Diagnosis Date  . GERD (gastroesophageal reflux disease)   . Thyroid disease   . Insomnia   . Bipolar 1 disorder   . Morbid obesity   . IBS (irritable bowel syndrome)   . Whiplash    Past Surgical History  Procedure Laterality Date  . Cholecystectomy    . Tympanostomy tube placement     No family history on file. History  Substance Use Topics  . Smoking status: Never Smoker   . Smokeless tobacco: Not on file  . Alcohol Use: No   OB History   Grav Para Term Preterm Abortions TAB SAB Ect Mult Living                  Review of Systems  Constitutional: Negative for fever.  Respiratory: Negative for cough and shortness of breath.   Cardiovascular: Negative for chest pain.  Gastrointestinal: Negative for nausea, vomiting, abdominal pain and diarrhea.  Neurological: Positive for headaches.  All other systems reviewed and are negative.     Allergies  Amoxapine and related; Amoxicillin; Aspartame and phenylalanine; Dilaudid; Doxycycline; Flagyl; Keflex; Keppra; Milk-related compounds; Neurontin; Other; Prednisone; Pristiq; Provigil; Topamax; and Toradol  Home Medications   Prior to Admission medications   Medication Sig Start Date End Date Taking? Authorizing Provider  Dexlansoprazole (DEXILANT PO) Take by mouth.   Yes Historical Provider, MD  DULoxetine HCl (CYMBALTA PO) Take by mouth.   Yes Historical Provider, MD  FLUoxetine HCl (PROZAC PO) Take by mouth.   Yes Historical Provider, MD  Mirabegron (MYRBETRIQ PO) Take by mouth.   Yes Historical Provider, MD  Zolpidem Tartrate (AMBIEN PO) Take by mouth.   Yes Historical Provider, MD  clidinium-chlordiazePOXIDE (LIBRAX) 5-2.5 MG per capsule Take 1 capsule by mouth.    Historical Provider, MD  clonazePAM (KLONOPIN) 0.5 MG tablet Take 0.5 mg by mouth 3 (three) times daily. 1mg  at lunchtime only    Historical Provider, MD  dicyclomine (BENTYL) 20 MG tablet Take 1 tablet (20 mg total) by mouth 2 (two) times daily. 09/04/14   April Smitty CordsK Palumbo-Rasch, MD  divalproex (DEPAKOTE)  500 MG DR tablet Take 500 mg by mouth 3 (three) times daily.    Historical Provider, MD  eszopiclone (LUNESTA) 2 MG TABS tablet Take 3 mg by mouth at bedtime as needed for sleep. Take immediately before bedtime    Historical Provider, MD  fluconazole (DIFLUCAN) 150 MG tablet Take 150 mg by mouth daily.    Historical Provider, MD  furosemide (LASIX) 20 MG tablet Take 2 tablets (40 mg total) by mouth daily. 05/03/14   Shon Batonourtney F Horton, MD  haloperidol (HALDOL) 5 MG tablet Take 5 mg by  mouth 2 (two) times daily.    Historical Provider, MD  hydrOXYzine (ATARAX/VISTARIL) 25 MG tablet Take 1 tablet (25 mg total) by mouth every 4 (four) hours as needed for itching. 05/11/14   Cristen Bredeson L Molpus, MD  levothyroxine (SYNTHROID, LEVOTHROID) 75 MCG tablet Take 88 mcg by mouth daily before breakfast.     Historical Provider, MD  lithium 300 MG tablet Take 450 mg by mouth at bedtime.     Historical Provider, MD  loratadine (CLARITIN) 10 MG tablet Take 10 mg by mouth daily.    Historical Provider, MD  Milnacipran (SAVELLA) 50 MG TABS tablet Take 50 mg by mouth 2 (two) times daily.    Historical Provider, MD  montelukast (SINGULAIR) 10 MG tablet Take 10 mg by mouth at bedtime.    Historical Provider, MD  nitrofurantoin, macrocrystal-monohydrate, (MACROBID) 100 MG capsule Take 1 capsule (100 mg total) by mouth 2 (two) times daily. X 7 days 09/04/14   April K Palumbo-Rasch, MD  omeprazole (PRILOSEC) 20 MG capsule Take 1 capsule (20 mg total) by mouth daily. 07/22/14   April K Palumbo-Rasch, MD  orphenadrine (NORFLEX) 100 MG tablet Take 1 tablet (100 mg total) by mouth 2 (two) times daily. 05/18/14   Enid SkeensJoshua M Zavitz, MD  oxyCODONE-acetaminophen (PERCOCET) 5-325 MG per tablet Take 1-2 tablets by mouth every 6 (six) hours as needed (for pain). 05/11/14   Carlisle BeersJohn L Molpus, MD  phenazopyridine (PYRIDIUM) 200 MG tablet Take 1 tablet (200 mg total) by mouth 3 (three) times daily. 09/04/14   April K Palumbo-Rasch, MD  pindolol (VISKEN) 5 MG tablet Take 5 mg by mouth 2 (two) times daily.    Historical Provider, MD  Potassium Chloride CR (MICRO-K) 8 MEQ CPCR capsule CR Take 8 mEq by mouth.    Historical Provider, MD  primidone (MYSOLINE) 50 MG tablet Take by mouth 4 (four) times daily.    Historical Provider, MD  ramelteon (ROZEREM) 8 MG tablet Take 8 mg by mouth at bedtime.    Historical Provider, MD  ranitidine (ZANTAC) 300 MG tablet Take 300 mg by mouth at bedtime.    Historical Provider, MD  risperidone (RISPERDAL)  4 MG tablet Take 4 mg by mouth 2 (two) times daily.    Historical Provider, MD  temazepam (RESTORIL) 30 MG capsule Take 45 mg by mouth at bedtime as needed for sleep.    Historical Provider, MD  terbinafine (LAMISIL AT) 1 % cream Apply to rash twice daily. 08/17/14   Merriel Zinger L Molpus, MD   BP 130/74  Pulse 92  Temp(Src) 98.3 F (36.8 C) (Oral)  Resp 16  Ht 5\' 2"  (1.575 m)  Wt 271 lb (122.925 kg)  BMI 49.55 kg/m2  SpO2 97% Physical Exam  Nursing note and vitals reviewed. Constitutional: She is oriented to person, place, and time. She appears well-developed and well-nourished. No distress.  HENT:  Head: Normocephalic and atraumatic.  Mouth/Throat: Oropharynx is  clear and moist.  Eyes: Conjunctivae are normal. Pupils are equal, round, and reactive to light. No scleral icterus.  Neck: Neck supple.  Cardiovascular: Normal rate, regular rhythm, normal heart sounds and intact distal pulses.   No murmur heard. Pulmonary/Chest: Effort normal and breath sounds normal. No stridor. No respiratory distress. She has no rales.  Abdominal: Soft. Bowel sounds are normal. She exhibits no distension. There is no tenderness.  Musculoskeletal: Normal range of motion.  Neurological: She is alert and oriented to person, place, and time. She has normal strength. No cranial nerve deficit or sensory deficit. Coordination and gait normal. GCS eye subscore is 4. GCS verbal subscore is 5. GCS motor subscore is 6.  Skin: Skin is warm and dry. No rash noted.  Psychiatric: She has a normal mood and affect. Her behavior is normal.    ED Course  Procedures (including critical care time) DIAGNOSTIC STUDIES: Oxygen Saturation is 97% on RA, normal by my interpretation.    COORDINATION OF CARE: 7:34 PM Discussed treatment plan with pt at bedside and pt agreed to plan.  Labs Review Labs Reviewed - No data to display  Imaging Review No results found.   EKG Interpretation None      MDM   Final diagnoses:   Acute nonintractable headache, unspecified headache type    38 year old female with a history of chronic pain and headaches. Presents with an episode of headache which she says is identical to all prior episodes. Symptoms most consistent with tension type headache. Her symptoms completely resolved after treatment with Atarax, Reglan, Phenergan, and Robaxin. She states that these medications are what typically helps her symptoms. Clinical picture not consistent with meningitis or subarachnoid hemorrhage.    I personally performed the services described in this documentation, which was scribed in my presence. The recorded information has been reviewed and is accurate. Jessica Forester, MD 09/27/14 [phone]

## 2014-09-26 NOTE — Discharge Instructions (Signed)

## 2014-09-26 NOTE — ED Notes (Signed)
Pt arrived to triage with written list of allergies and home meds-when asked clarifying ?s if any changes since last recent visit and review-pt became angy-explained to pt that the ?s were in attempt to speed the triage process and have her in a tx room-pt advised me "just do your job. I don't care how long it takes I don't have anywhere to be"-pt then asked the wait time-advised she would be seen by provider asap-pt gave me a partial copy of what she said was her list of meds given during her last visit and that those are the meds she is requesting today-advised pt to discuss list with provider due to he/she would be the on ordering any meds-during triage pt asked my name and EMT's name-both were given to pt without hesitation

## 2014-09-26 NOTE — ED Notes (Signed)
C/o "pain all over my body" x today with a "migraine for days"

## 2014-09-26 NOTE — ED Notes (Signed)
MD aware of change in BP from previous, pain has decreased otherwise no change in assessment.

## 2014-11-08 ENCOUNTER — Emergency Department (HOSPITAL_BASED_OUTPATIENT_CLINIC_OR_DEPARTMENT_OTHER)
Admission: EM | Admit: 2014-11-08 | Discharge: 2014-11-08 | Disposition: A | Discharge status: Home / Self Care | Payer: No Typology Code available for payment source | Attending: Emergency Medicine | Admitting: Emergency Medicine

## 2014-11-08 ENCOUNTER — Encounter (HOSPITAL_BASED_OUTPATIENT_CLINIC_OR_DEPARTMENT_OTHER): Payer: Self-pay | Admitting: Emergency Medicine

## 2014-11-08 DIAGNOSIS — Z880 Allergy status to penicillin: Secondary | ICD-10-CM | POA: Diagnosis not present

## 2014-11-08 DIAGNOSIS — R202 Paresthesia of skin: Secondary | ICD-10-CM | POA: Diagnosis not present

## 2014-11-08 DIAGNOSIS — Z87828 Personal history of other (healed) physical injury and trauma: Secondary | ICD-10-CM | POA: Diagnosis not present

## 2014-11-08 DIAGNOSIS — Z792 Long term (current) use of antibiotics: Secondary | ICD-10-CM | POA: Insufficient documentation

## 2014-11-08 DIAGNOSIS — Z79899 Other long term (current) drug therapy: Secondary | ICD-10-CM | POA: Insufficient documentation

## 2014-11-08 DIAGNOSIS — F319 Bipolar disorder, unspecified: Secondary | ICD-10-CM | POA: Diagnosis not present

## 2014-11-08 DIAGNOSIS — E079 Disorder of thyroid, unspecified: Secondary | ICD-10-CM | POA: Insufficient documentation

## 2014-11-08 DIAGNOSIS — K219 Gastro-esophageal reflux disease without esophagitis: Secondary | ICD-10-CM | POA: Insufficient documentation

## 2014-11-08 DIAGNOSIS — K589 Irritable bowel syndrome without diarrhea: Secondary | ICD-10-CM | POA: Diagnosis not present

## 2014-11-08 DIAGNOSIS — Z79891 Long term (current) use of opiate analgesic: Secondary | ICD-10-CM | POA: Diagnosis not present

## 2014-11-08 DIAGNOSIS — G4700 Insomnia, unspecified: Secondary | ICD-10-CM | POA: Insufficient documentation

## 2014-11-08 DIAGNOSIS — R42 Dizziness and giddiness: Secondary | ICD-10-CM | POA: Diagnosis present

## 2014-11-08 HISTORY — DX: Obstructive sleep apnea (adult) (pediatric): G47.33

## 2014-11-08 LAB — CBC WITH DIFFERENTIAL/PLATELET
Basophils Absolute: 0.1 10*3/uL (ref 0.0–0.1)
Basophils Relative: 1 % (ref 0–1)
EOS PCT: 3 % (ref 0–5)
Eosinophils Absolute: 0.2 10*3/uL (ref 0.0–0.7)
HEMATOCRIT: 36.7 % (ref 36.0–46.0)
HEMOGLOBIN: 11.5 g/dL — AB (ref 12.0–15.0)
Lymphocytes Relative: 36 % (ref 12–46)
Lymphs Abs: 2.8 10*3/uL (ref 0.7–4.0)
MCH: 30 pg (ref 26.0–34.0)
MCHC: 31.3 g/dL (ref 30.0–36.0)
MCV: 95.8 fL (ref 78.0–100.0)
Monocytes Absolute: 1 10*3/uL (ref 0.1–1.0)
Monocytes Relative: 13 % — ABNORMAL HIGH (ref 3–12)
Neutro Abs: 3.7 10*3/uL (ref 1.7–7.7)
Neutrophils Relative %: 47 % (ref 43–77)
Platelets: 286 10*3/uL (ref 150–400)
RBC: 3.83 MIL/uL — ABNORMAL LOW (ref 3.87–5.11)
RDW: 13.9 % (ref 11.5–15.5)
WBC: 7.7 10*3/uL (ref 4.0–10.5)

## 2014-11-08 LAB — BASIC METABOLIC PANEL
Anion gap: 14 (ref 5–15)
BUN: 10 mg/dL (ref 6–23)
CO2: 29 mEq/L (ref 19–32)
Calcium: 9.3 mg/dL (ref 8.4–10.5)
Chloride: 97 mEq/L (ref 96–112)
Creatinine, Ser: 0.7 mg/dL (ref 0.50–1.10)
Glucose, Bld: 104 mg/dL — ABNORMAL HIGH (ref 70–99)
Potassium: 4.1 mEq/L (ref 3.7–5.3)
Sodium: 140 mEq/L (ref 137–147)

## 2014-11-08 LAB — TROPONIN I: Troponin I: <0.3 ng/mL (ref <0.30)

## 2014-11-08 MED ORDER — LORAZEPAM 1 MG PO TABS
1.0000 mg | ORAL_TABLET | Freq: Once | ORAL | Status: AC
Start: 2014-11-08 — End: 2014-11-08
  Administered 2014-11-08: 1 mg via ORAL
  Filled 2014-11-08: qty 1

## 2014-11-08 NOTE — ED Notes (Signed)
Pt reports new psychiatrist at [facility], last follow up visit 12/8, all records obtained

## 2014-11-08 NOTE — Discharge Instructions (Signed)

## 2014-11-08 NOTE — ED Notes (Signed)
Pt very talkative in triage, in no acute distress, alert oriented and moving all extremities without difficulty

## 2014-11-08 NOTE — ED Provider Notes (Signed)
CSN: [id number]     Arrival date & time 11/08/14  0129 History   First MD Initiated Contact with Patient 11/08/14 [phone]     Chief Complaint  Patient presents with  . Dizziness     (Consider location/radiation/quality/duration/timing/severity/associated sxs/prior Treatment) Patient is a 38 y.o. female presenting with dizziness. The history is provided by the patient.  Dizziness She woke up at about 12:30 AM and noted that her legs were numb. This spread to involve her arms. She also has noted some slight periorbital numbness. She started having a vague discomfort in her chest. She cannot describe it but rates it at 6/10. Nothing makes her symptoms better and nothing makes them worse. They have been waxing and waning since onset. She denies dyspnea, nausea, diaphoresis. She did take a dose of aspirin 325 mg. Of note, she was recently started on CPAP for sleep apnea. She is under care of a psychiatrist who recently discontinued lithium. She continues on amiodarone and risperidone. She is scheduled to see a neurologist in about one month at which time decision will be made about discontinuing primidone.  Past Medical History  Diagnosis Date  . GERD (gastroesophageal reflux disease)   . Thyroid disease   . Insomnia   . Bipolar 1 disorder   . Morbid obesity   . IBS (irritable bowel syndrome)   . Whiplash   . OSA (obstructive sleep apnea)    Past Surgical History  Procedure Laterality Date  . Cholecystectomy    . Tympanostomy tube placement     History reviewed. No pertinent family history. History  Substance Use Topics  . Smoking status: Never Smoker   . Smokeless tobacco: Not on file  . Alcohol Use: No   OB History    No data available     Review of Systems  Neurological: Positive for dizziness.  All other systems reviewed and are negative.     Allergies  Amoxapine and related; Amoxicillin; Aspartame and phenylalanine; Dilaudid; Doxycycline; Flagyl; Keflex; Keppra;  Milk-related compounds; Neurontin; Other; Prednisone; Pristiq; Provigil; Topamax; and Toradol  Home Medications   Prior to Admission medications   Medication Sig Start Date End Date Taking? Authorizing Provider  clonazePAM (KLONOPIN) 0.5 MG tablet Take 0.5 mg by mouth 3 (three) times daily. 1mg  at lunchtime only   Yes Historical Provider, MD  Dexlansoprazole (DEXILANT PO) Take by mouth.   Yes Historical Provider, MD  DULoxetine HCl (CYMBALTA PO) Take by mouth.   Yes Historical Provider, MD  Fluticasone Furoate-Vilanterol (BREO ELLIPTA) 100-25 MCG/INH AEPB Inhale into the lungs.   Yes Historical Provider, MD  furosemide (LASIX) 20 MG tablet Take 2 tablets (40 mg total) by mouth daily. 05/03/14  Yes Shon Batonourtney F Horton, MD  levalbuterol [facility](XOPENEX HFA) 45 MCG/ACT inhaler Inhale into the lungs every 4 (four) hours as needed for wheezing.   Yes Historical Provider, MD  levothyroxine (SYNTHROID, LEVOTHROID) 75 MCG tablet Take 88 mcg by mouth daily before breakfast.    Yes Historical Provider, MD  loratadine (CLARITIN) 10 MG tablet Take 10 mg by mouth daily.   Yes Historical Provider, MD  mirabegron ER (MYRBETRIQ) 50 MG TB24 tablet Take 50 mg by mouth daily.   Yes Historical Provider, MD  pindolol (VISKEN) 5 MG tablet Take 5 mg by mouth 2 (two) times daily.   Yes Historical Provider, MD  Potassium Chloride CR (MICRO-K) 8 MEQ CPCR capsule CR Take 8 mEq by mouth.   Yes Historical Provider, MD  primidone (MYSOLINE) 50 MG tablet Take  by mouth 4 (four) times daily.   Yes Historical Provider, MD  ranitidine (ZANTAC) 300 MG tablet Take 300 mg by mouth at bedtime.   Yes Historical Provider, MD  risperidone (RISPERDAL) 4 MG tablet Take 2 mg by mouth 2 (two) times daily.    Yes Historical Provider, MD  Zolpidem Tartrate (AMBIEN PO) Take by mouth.   Yes Historical Provider, MD  clidinium-chlordiazePOXIDE (LIBRAX) 5-2.5 MG per capsule Take 1 capsule by mouth.    Historical Provider, MD  dicyclomine (BENTYL) 20 MG  tablet Take 1 tablet (20 mg total) by mouth 2 (two) times daily. 09/04/14   April K Palumbo-Rasch, MD  divalproex (DEPAKOTE) 500 MG DR tablet Take 500 mg by mouth 3 (three) times daily.    Historical Provider, MD  eszopiclone (LUNESTA) 2 MG TABS tablet Take 3 mg by mouth at bedtime as needed for sleep. Take immediately before bedtime    Historical Provider, MD  fluconazole (DIFLUCAN) 150 MG tablet Take 150 mg by mouth daily.    Historical Provider, MD  FLUoxetine HCl (PROZAC PO) Take by mouth.    Historical Provider, MD  haloperidol (HALDOL) 5 MG tablet Take 5 mg by mouth 2 (two) times daily.    Historical Provider, MD  hydrOXYzine (ATARAX/VISTARIL) 25 MG tablet Take 1 tablet (25 mg total) by mouth every 4 (four) hours as needed for itching. 05/11/14   Carlisle BeersJohn L Molpus, MD  lithium 300 MG tablet Take 450 mg by mouth at bedtime.     Historical Provider, MD  Milnacipran (SAVELLA) 50 MG TABS tablet Take 50 mg by mouth 2 (two) times daily.    Historical Provider, MD  Mirabegron (MYRBETRIQ PO) Take by mouth.    Historical Provider, MD  montelukast (SINGULAIR) 10 MG tablet Take 10 mg by mouth at bedtime.    Historical Provider, MD  nitrofurantoin, macrocrystal-monohydrate, (MACROBID) 100 MG capsule Take 1 capsule (100 mg total) by mouth 2 (two) times daily. X 7 days 09/04/14   April K Palumbo-Rasch, MD  omeprazole (PRILOSEC) 20 MG capsule Take 1 capsule (20 mg total) by mouth daily. 07/22/14   April K Palumbo-Rasch, MD  orphenadrine (NORFLEX) 100 MG tablet Take 1 tablet (100 mg total) by mouth 2 (two) times daily. 05/18/14   Enid SkeensJoshua M Zavitz, MD  oxyCODONE-acetaminophen (PERCOCET) 5-325 MG per tablet Take 1-2 tablets by mouth every 6 (six) hours as needed (for pain). 05/11/14   Carlisle BeersJohn L Molpus, MD  phenazopyridine (PYRIDIUM) 200 MG tablet Take 1 tablet (200 mg total) by mouth 3 (three) times daily. 09/04/14   April K Palumbo-Rasch, MD  ramelteon (ROZEREM) 8 MG tablet Take 8 mg by mouth at bedtime.    Historical  Provider, MD  temazepam (RESTORIL) 30 MG capsule Take 45 mg by mouth at bedtime as needed for sleep.    Historical Provider, MD  terbinafine (LAMISIL AT) 1 % cream Apply to rash twice daily. 08/17/14   John L Molpus, MD   BP 108/57 mmHg  Pulse 88  Temp(Src) 98.6 F (37 C) (Oral)  Resp 18  Ht 5\' 2"  (1.575 m)  Wt 270 lb (122.471 kg)  BMI 49.37 kg/m2  SpO2 100% Physical Exam  Nursing note and vitals reviewed.  Obese 38 year old female, resting comfortably and in no acute distress. Vital signs are normal. Oxygen saturation is 100%, which is normal. Head is normocephalic and atraumatic. PERRLA, EOMI. Oropharynx is clear. Neck is nontender and supple without adenopathy or JVD. Back is nontender and there is  no CVA tenderness. Lungs are clear without rales, wheezes, or rhonchi. Chest is nontender. Heart has regular rate and rhythm without murmur. Abdomen is soft, flat, nontender without masses or hepatosplenomegaly and peristalsis is normoactive. Extremities have 1+ edema, full range of motion is present. Skin is warm and dry without rash. Neurologic: Mental status is normal, cranial nerves are intact, there are no motor or sensory deficits.  ED Course  Procedures (including critical care time) Labs Review Results for orders placed or performed during the hospital encounter of 11/08/14  CBC with Differential  Result Value Ref Range   WBC 7.7 4.0 - 10.5 K/uL   RBC 3.83 (L) 3.87 - 5.11 MIL/uL   Hemoglobin 11.5 (L) 12.0 - 15.0 g/dL   HCT 41.3 24.4 - 01.0 %   MCV 95.8 78.0 - 100.0 fL   MCH 30.0 26.0 - 34.0 pg   MCHC 31.3 30.0 - 36.0 g/dL   RDW 27.2 53.6 - 64.4 %   Platelets 286 150 - 400 K/uL   Neutrophils Relative % 47 43 - 77 %   Neutro Abs 3.7 1.7 - 7.7 K/uL   Lymphocytes Relative 36 12 - 46 %   Lymphs Abs 2.8 0.7 - 4.0 K/uL   Monocytes Relative 13 (H) 3 - 12 %   Monocytes Absolute 1.0 0.1 - 1.0 K/uL   Eosinophils Relative 3 0 - 5 %   Eosinophils Absolute 0.2 0.0 - 0.7 K/uL    Basophils Relative 1 0 - 1 %   Basophils Absolute 0.1 0.0 - 0.1 K/uL  Basic metabolic panel  Result Value Ref Range   Sodium 140 137 - 147 mEq/L   Potassium 4.1 3.7 - 5.3 mEq/L   Chloride 97 96 - 112 mEq/L   CO2 29 19 - 32 mEq/L   Glucose, Bld 104 (H) 70 - 99 mg/dL   BUN 10 6 - 23 mg/dL   Creatinine, Ser 0.34 0.50 - 1.10 mg/dL   Calcium 9.3 8.4 - 74.2 mg/dL   GFR calc non Af Amer >90 >90 mL/min   GFR calc Af Amer >90 >90 mL/min   Anion gap 14 5 - 15  Troponin I  Result Value Ref Range   Troponin I <0.30 <0.30 ng/mL    EKG Interpretation   Date/Time:  Thursday November 08 2014 02:48:06 EST Ventricular Rate:  79 PR Interval:  148 QRS Duration: 78 QT Interval:  408 QTC Calculation: 467 R Axis:   23 Text Interpretation:  Normal sinus rhythm Low voltage QRS Borderline ECG  No old tracing to compare Confirmed by [facility]  MD, Taniyah Ballow ([id number]) on  11/08/2014 2:59:09 AM      MDM   Final diagnoses:  Paresthesia    Numbness of uncertain cause. No findings on physical exam. Screening labs will be obtained. Will try a dose of lorazepam.  She had significant but not complete relief of symptoms with lorazepam. I am still suspicious that hyperventilation was a significant part of her symptoms but it is not completely clear and that there is not another cause as well. No evidence of any underlying neurologic condition as numbness is not following any clear neurologic pattern. She is referred back to her psychiatrist and neurologist.  Dione Booze, MD 11/08/14 [phone]

## 2014-11-08 NOTE — ED Notes (Signed)
Pt states that she awoke this am with her legs feeling numb, sat up in bed and noted to that her arms felt numb, walked downstairs and took an aspirin, drove herself to the hospital, now feels weak like she can not bear weight, recently started on cpap 1 week ago for osa

## 2014-11-22 ENCOUNTER — Encounter (HOSPITAL_BASED_OUTPATIENT_CLINIC_OR_DEPARTMENT_OTHER): Payer: Self-pay | Admitting: *Deleted

## 2014-11-22 ENCOUNTER — Emergency Department (HOSPITAL_BASED_OUTPATIENT_CLINIC_OR_DEPARTMENT_OTHER)
Admission: EM | Admit: 2014-11-22 | Discharge: 2014-11-22 | Disposition: A | Discharge status: Home / Self Care | Payer: No Typology Code available for payment source | Attending: Emergency Medicine | Admitting: Emergency Medicine

## 2014-11-22 DIAGNOSIS — E079 Disorder of thyroid, unspecified: Secondary | ICD-10-CM | POA: Insufficient documentation

## 2014-11-22 DIAGNOSIS — J45909 Unspecified asthma, uncomplicated: Secondary | ICD-10-CM | POA: Diagnosis not present

## 2014-11-22 DIAGNOSIS — Z87828 Personal history of other (healed) physical injury and trauma: Secondary | ICD-10-CM | POA: Diagnosis not present

## 2014-11-22 DIAGNOSIS — F319 Bipolar disorder, unspecified: Secondary | ICD-10-CM | POA: Insufficient documentation

## 2014-11-22 DIAGNOSIS — Z880 Allergy status to penicillin: Secondary | ICD-10-CM | POA: Diagnosis not present

## 2014-11-22 DIAGNOSIS — G4700 Insomnia, unspecified: Secondary | ICD-10-CM | POA: Insufficient documentation

## 2014-11-22 DIAGNOSIS — Z79899 Other long term (current) drug therapy: Secondary | ICD-10-CM | POA: Insufficient documentation

## 2014-11-22 DIAGNOSIS — N390 Urinary tract infection, site not specified: Secondary | ICD-10-CM

## 2014-11-22 DIAGNOSIS — R300 Dysuria: Secondary | ICD-10-CM | POA: Diagnosis present

## 2014-11-22 DIAGNOSIS — K219 Gastro-esophageal reflux disease without esophagitis: Secondary | ICD-10-CM | POA: Diagnosis not present

## 2014-11-22 DIAGNOSIS — Z7951 Long term (current) use of inhaled steroids: Secondary | ICD-10-CM | POA: Insufficient documentation

## 2014-11-22 HISTORY — DX: Unspecified asthma, uncomplicated: J45.909

## 2014-11-22 LAB — URINALYSIS, ROUTINE W REFLEX MICROSCOPIC
Bilirubin Urine: NEGATIVE
GLUCOSE, UA: NEGATIVE mg/dL
KETONES UR: NEGATIVE mg/dL
LEUKOCYTES UA: NEGATIVE
Nitrite: NEGATIVE
PROTEIN: NEGATIVE mg/dL
Specific Gravity, Urine: 1.008 (ref 1.005–1.030)
UROBILINOGEN UA: 0.2 mg/dL (ref 0.0–1.0)
pH: 7 (ref 5.0–8.0)

## 2014-11-22 LAB — URINE MICROSCOPIC-ADD ON

## 2014-11-22 LAB — PREGNANCY, URINE: Preg Test, Ur: NEGATIVE

## 2014-11-22 MED ORDER — PHENAZOPYRIDINE HCL 200 MG PO TABS: 200.0000 mg | ORAL_TABLET | Freq: Three times a day (TID) | ORAL | Status: DC

## 2014-11-22 MED ORDER — NITROFURANTOIN MONOHYD MACRO 100 MG PO CAPS: 100.0000 mg | ORAL_CAPSULE | Freq: Two times a day (BID) | ORAL | Status: DC

## 2014-11-22 MED ORDER — PHENAZOPYRIDINE HCL 100 MG PO TABS
200.0000 mg | ORAL_TABLET | Freq: Once | ORAL | Status: AC
  Administered 2014-11-22: 200 mg via ORAL
  Filled 2014-11-22: qty 2

## 2014-11-22 MED ORDER — NITROFURANTOIN MONOHYD MACRO 100 MG PO CAPS
100.0000 mg | ORAL_CAPSULE | Freq: Once | ORAL | Status: AC
  Administered 2014-11-22: 100 mg via ORAL
  Filled 2014-11-22: qty 1

## 2014-11-22 NOTE — ED Notes (Signed)
Pt ambulating independently w/ steady gait on d/c in no acute distress, A&Ox4. D/c instructions reviewed w/ pt - pt denies any further questions or concerns at present. Rx given x2  

## 2014-11-22 NOTE — ED Notes (Signed)
Pt reports dysuria and bilat flank pain, acute onset approx 0130 today. Pt [facility] and was told at that time she possibly had a UTI however was not treated. Pt denies fever, vomiting or diarrhea - admits to some nausea.

## 2014-11-22 NOTE — ED Provider Notes (Signed)
CSN: [id number]     Arrival date & time 11/22/14  0230 History   First MD Initiated Contact with Patient 11/22/14 0257     Chief Complaint  Patient presents with  . Dysuria  . Flank Pain     (Consider location/radiation/quality/duration/timing/severity/associated sxs/prior Treatment) Patient is a 38 y.o. female presenting with dysuria. The history is provided by the patient.  Dysuria Pain quality:  Burning Pain severity:  Moderate Onset quality:  Gradual Timing:  Constant Progression:  Unchanged Chronicity:  Recurrent Recent urinary tract infections: yes   Relieved by:  Nothing Worsened by:  Nothing tried Ineffective treatments:  None tried Associated symptoms: no fever   Risk factors: recurrent urinary tract infections   Risk factors: no renal disease   B low back pain below the level of the pelvic bones.  No true flank pain per se   Past Medical History  Diagnosis Date  . GERD (gastroesophageal reflux disease)   . Thyroid disease   . Insomnia   . Bipolar 1 disorder   . Morbid obesity   . IBS (irritable bowel syndrome)   . Whiplash   . OSA (obstructive sleep apnea)   . Asthma    Past Surgical History  Procedure Laterality Date  . Cholecystectomy    . Tympanostomy tube placement     History reviewed. No pertinent family history. History  Substance Use Topics  . Smoking status: Never Smoker   . Smokeless tobacco: Not on file  . Alcohol Use: No   OB History    No data available     Review of Systems  Constitutional: Negative for fever.  Genitourinary: Positive for dysuria.  All other systems reviewed and are negative.     Allergies  Amoxapine and related; Amoxicillin; Aspartame and phenylalanine; Dilaudid; Doxycycline; Flagyl; Keflex; Keppra; Milk-related compounds; Neurontin; Other; Prednisone; Pristiq; Provigil; Topamax; and Toradol  Home Medications   Prior to Admission medications   Medication Sig Start Date End Date Taking? Authorizing  Provider  clidinium-chlordiazePOXIDE (LIBRAX) 5-2.5 MG per capsule Take 1 capsule by mouth.   Yes Historical Provider, MD  clonazePAM (KLONOPIN) 0.5 MG tablet Take 0.5 mg by mouth 3 (three) times daily. 1mg  at lunchtime only   Yes Historical Provider, MD  Dexlansoprazole (DEXILANT PO) Take by mouth.   Yes Historical Provider, MD  divalproex (DEPAKOTE) 500 MG DR tablet Take 500 mg by mouth 3 (three) times daily.   Yes Historical Provider, MD  DULoxetine HCl (CYMBALTA PO) Take by mouth.   Yes Historical Provider, MD  Fluticasone Furoate-Vilanterol (BREO ELLIPTA) 100-25 MCG/INH AEPB Inhale into the lungs.   Yes Historical Provider, MD  furosemide (LASIX) 20 MG tablet Take 2 tablets (40 mg total) by mouth daily. 05/03/14  Yes Shon Batonourtney F Horton, MD  hydrOXYzine (ATARAX/VISTARIL) 25 MG tablet Take 1 tablet (25 mg total) by mouth every 4 (four) hours as needed for itching. 05/11/14  Yes John L Molpus, MD  levothyroxine (SYNTHROID, LEVOTHROID) 75 MCG tablet Take 88 mcg by mouth daily before breakfast.    Yes Historical Provider, MD  lithium 300 MG tablet Take 450 mg by mouth at bedtime.    Yes Historical Provider, MD  loratadine (CLARITIN) 10 MG tablet Take 10 mg by mouth daily.   Yes Historical Provider, MD  Mirabegron (MYRBETRIQ PO) Take by mouth.   Yes Historical Provider, MD  mirabegron ER (MYRBETRIQ) 50 MG TB24 tablet Take 50 mg by mouth daily.   Yes Historical Provider, MD  montelukast (SINGULAIR) 10 MG  tablet Take 10 mg by mouth at bedtime.   Yes Historical Provider, MD  pindolol (VISKEN) 5 MG tablet Take 5 mg by mouth 2 (two) times daily.   Yes Historical Provider, MD  Potassium Chloride CR (MICRO-K) 8 MEQ CPCR capsule CR Take 8 mEq by mouth.   Yes Historical Provider, MD  primidone (MYSOLINE) 50 MG tablet Take by mouth 4 (four) times daily.   Yes Historical Provider, MD  ranitidine (ZANTAC) 300 MG tablet Take 300 mg by mouth at bedtime.   Yes Historical Provider, MD  risperidone (RISPERDAL) 4 MG  tablet Take 2 mg by mouth daily.    Yes Historical Provider, MD  Zolpidem Tartrate (AMBIEN PO) Take by mouth.   Yes Historical Provider, MD  dicyclomine (BENTYL) 20 MG tablet Take 1 tablet (20 mg total) by mouth 2 (two) times daily. 09/04/14   Ashtin Melichar K Lurleen Soltero-Rasch, MD  eszopiclone (LUNESTA) 2 MG TABS tablet Take 3 mg by mouth at bedtime as needed for sleep. Take immediately before bedtime    Historical Provider, MD  fluconazole (DIFLUCAN) 150 MG tablet Take 150 mg by mouth daily.    Historical Provider, MD  FLUoxetine HCl (PROZAC PO) Take by mouth.    Historical Provider, MD  haloperidol (HALDOL) 5 MG tablet Take 5 mg by mouth 2 (two) times daily.    Historical Provider, MD  levalbuterol [facility](XOPENEX HFA) 45 MCG/ACT inhaler Inhale into the lungs every 4 (four) hours as needed for wheezing.    Historical Provider, MD  Milnacipran (SAVELLA) 50 MG TABS tablet Take 50 mg by mouth 2 (two) times daily.    Historical Provider, MD  nitrofurantoin, macrocrystal-monohydrate, (MACROBID) 100 MG capsule Take 1 capsule (100 mg total) by mouth 2 (two) times daily. X 7 days 09/04/14   Deyana Wnuk K Jakia Kennebrew-Rasch, MD  nitrofurantoin, macrocrystal-monohydrate, (MACROBID) 100 MG capsule Take 1 capsule (100 mg total) by mouth 2 (two) times daily. X 7 days 11/22/14   Khaidyn Staebell K Wen Munford-Rasch, MD  omeprazole (PRILOSEC) 20 MG capsule Take 1 capsule (20 mg total) by mouth daily. 07/22/14   Aixa Corsello K Kamylah Manzo-Rasch, MD  orphenadrine (NORFLEX) 100 MG tablet Take 1 tablet (100 mg total) by mouth 2 (two) times daily. 05/18/14   Enid SkeensJoshua M Zavitz, MD  oxyCODONE-acetaminophen (PERCOCET) 5-325 MG per tablet Take 1-2 tablets by mouth every 6 (six) hours as needed (for pain). 05/11/14   Carlisle BeersJohn L Molpus, MD  phenazopyridine (PYRIDIUM) 200 MG tablet Take 1 tablet (200 mg total) by mouth 3 (three) times daily. 09/04/14   Natasa Stigall K Nubia Ziesmer-Rasch, MD  phenazopyridine (PYRIDIUM) 200 MG tablet Take 1 tablet (200 mg total) by mouth 3 (three) times daily. 11/22/14    Janiyah Beery K Jeromiah Ohalloran-Rasch, MD  ramelteon (ROZEREM) 8 MG tablet Take 8 mg by mouth at bedtime.    Historical Provider, MD  temazepam (RESTORIL) 30 MG capsule Take 45 mg by mouth at bedtime as needed for sleep.    Historical Provider, MD  terbinafine (LAMISIL AT) 1 % cream Apply to rash twice daily. 08/17/14   John L Molpus, MD   BP 125/77 mmHg  Pulse 84  Temp(Src) 98.3 F (36.8 C) (Oral)  Resp 20  Ht 5\' 2"  (1.575 m)  Wt 270 lb (122.471 kg)  BMI 49.37 kg/m2  SpO2 98% Physical Exam  Constitutional: She is oriented to person, place, and time. She appears well-developed and well-nourished. No distress.  HENT:  Head: Normocephalic and atraumatic.  Eyes: Conjunctivae and EOM are normal.  Neck: Normal range of  motion. Neck supple.  Cardiovascular: Normal rate and regular rhythm.   Pulmonary/Chest: Effort normal and breath sounds normal. No respiratory distress. She has no wheezes. She has no rales.  Abdominal: Soft. Bowel sounds are normal. There is no tenderness. There is no rebound and no guarding.  Musculoskeletal: Normal range of motion.  Neurological: She is alert and oriented to person, place, and time.  Skin: Skin is warm and dry.  Psychiatric: She has a normal mood and affect.    ED Course  Procedures (including critical care time) Labs Review Labs Reviewed  URINALYSIS, ROUTINE W REFLEX MICROSCOPIC - Abnormal; Notable for the following:    Hgb urine dipstick SMALL (*)    All other components within normal limits  URINE MICROSCOPIC-ADD ON - Abnormal; Notable for the following:    Squamous Epithelial / LPF FEW (*)    Bacteria, UA FEW (*)    All other components within normal limits  PREGNANCY, URINE    Imaging Review No results found.   EKG Interpretation None      MDM   Final diagnoses:  UTI (lower urinary tract infection)    Given symptoms patient is experiencing will treat for UTI.  Patient states macrobid has worked well in the past will prescribe same with  pyridium.      Jasmine Awe, MD 11/22/14 [phone]

## 2015-02-01 ENCOUNTER — Encounter (HOSPITAL_BASED_OUTPATIENT_CLINIC_OR_DEPARTMENT_OTHER): Payer: Self-pay | Admitting: *Deleted

## 2015-02-01 DIAGNOSIS — J45909 Unspecified asthma, uncomplicated: Secondary | ICD-10-CM | POA: Diagnosis not present

## 2015-02-01 DIAGNOSIS — Z79899 Other long term (current) drug therapy: Secondary | ICD-10-CM | POA: Insufficient documentation

## 2015-02-01 DIAGNOSIS — Z7951 Long term (current) use of inhaled steroids: Secondary | ICD-10-CM | POA: Insufficient documentation

## 2015-02-01 DIAGNOSIS — Y9389 Activity, other specified: Secondary | ICD-10-CM | POA: Diagnosis not present

## 2015-02-01 DIAGNOSIS — F319 Bipolar disorder, unspecified: Secondary | ICD-10-CM | POA: Insufficient documentation

## 2015-02-01 DIAGNOSIS — G4700 Insomnia, unspecified: Secondary | ICD-10-CM | POA: Diagnosis not present

## 2015-02-01 DIAGNOSIS — Z880 Allergy status to penicillin: Secondary | ICD-10-CM | POA: Diagnosis not present

## 2015-02-01 DIAGNOSIS — K219 Gastro-esophageal reflux disease without esophagitis: Secondary | ICD-10-CM | POA: Diagnosis not present

## 2015-02-01 DIAGNOSIS — T7840XA Allergy, unspecified, initial encounter: Secondary | ICD-10-CM | POA: Diagnosis not present

## 2015-02-01 DIAGNOSIS — X58XXXA Exposure to other specified factors, initial encounter: Secondary | ICD-10-CM | POA: Diagnosis not present

## 2015-02-01 DIAGNOSIS — Y998 Other external cause status: Secondary | ICD-10-CM | POA: Insufficient documentation

## 2015-02-01 DIAGNOSIS — K589 Irritable bowel syndrome without diarrhea: Secondary | ICD-10-CM | POA: Insufficient documentation

## 2015-02-01 DIAGNOSIS — E079 Disorder of thyroid, unspecified: Secondary | ICD-10-CM | POA: Insufficient documentation

## 2015-02-01 DIAGNOSIS — Y9289 Other specified places as the place of occurrence of the external cause: Secondary | ICD-10-CM | POA: Diagnosis not present

## 2015-02-01 MED ORDER — FAMOTIDINE 20 MG PO TABS
ORAL_TABLET | ORAL | Status: AC
  Administered 2015-02-01: 20 mg via ORAL
  Filled 2015-02-01: qty 1

## 2015-02-01 MED ORDER — FAMOTIDINE 20 MG PO TABS
20.0000 mg | ORAL_TABLET | Freq: Once | ORAL | Status: AC
  Administered 2015-02-01: 20 mg via ORAL

## 2015-02-01 NOTE — ED Notes (Signed)
Brought back to room d/t pt c/o "throat feeling worse, tighter, burning, swelling now, I didn't feel that way when I came in".  Now reports received dexamethasone by PCP at 1600 yesterday for sinus HA. Asking now if she could be reacting to that. Mentions I have had that before. Clarifies prednisone "just didn't make me feel good, probably just a side effect". Alert, NAD, calm, no dyspnea, speech clear.

## 2015-02-01 NOTE — ED Notes (Addendum)
C/o allergic reaction, describes as burning itching all over, onset 1700, h/o similar, took atarax 25mg  at 1700 and again at 2100, "but it has not helped like it has in the past", states, "not able to take benadryl b/c it makes me really hyper", "not sure what I am reacting too". Erythema noted to hands and face.  Denies airway issues or sx. H/o asthma. Alert, NAD, calm, interactive, speech clear, no dyspnea noted, LS CTA, throat difficult to visualize d/t body habitus, minimal swelling possible.

## 2015-02-01 NOTE — ED Notes (Signed)
Now mentions that she ate at [employer] today and feels like she might be reacting to something she ate.

## 2015-02-02 ENCOUNTER — Emergency Department (HOSPITAL_BASED_OUTPATIENT_CLINIC_OR_DEPARTMENT_OTHER)
Admission: EM | Admit: 2015-02-02 | Discharge: 2015-02-02 | Disposition: A | Discharge status: Home / Self Care | Payer: No Typology Code available for payment source | Attending: Emergency Medicine | Admitting: Emergency Medicine

## 2015-02-02 ENCOUNTER — Encounter (HOSPITAL_BASED_OUTPATIENT_CLINIC_OR_DEPARTMENT_OTHER): Payer: Self-pay | Admitting: Emergency Medicine

## 2015-02-02 DIAGNOSIS — T7840XA Allergy, unspecified, initial encounter: Secondary | ICD-10-CM

## 2015-02-02 MED ORDER — DEXAMETHASONE SODIUM PHOSPHATE 10 MG/ML IJ SOLN
10.0000 mg | Freq: Once | INTRAMUSCULAR | Status: AC
  Administered 2015-02-02: 10 mg via INTRAMUSCULAR
  Filled 2015-02-02: qty 1

## 2015-02-02 MED ORDER — METHYLPREDNISOLONE (PAK) 4 MG PO TABS: ORAL_TABLET | ORAL | Status: DC

## 2015-02-02 MED ORDER — FAMOTIDINE 20 MG PO TABS: 20.0000 mg | ORAL_TABLET | Freq: Two times a day (BID) | ORAL | Status: DC

## 2015-02-02 NOTE — Discharge Instructions (Signed)

## 2015-02-02 NOTE — ED Notes (Signed)
MD at bedside. 

## 2015-02-02 NOTE — ED Provider Notes (Signed)
CSN: [id number]     Arrival date & time 02/01/15  2126 History   First MD Initiated Contact with Patient 02/02/15 0102     Chief Complaint  Patient presents with  . Allergic Reaction     (Consider location/radiation/quality/duration/timing/severity/associated sxs/prior Treatment) Patient is a 39 y.o. female presenting with allergic reaction. The history is provided by the patient.  Allergic Reaction Presenting symptoms: itching   Presenting symptoms: no difficulty breathing, no difficulty swallowing, no rash and no wheezing   Presenting symptoms comment:  Burning of the skin Severity:  Mild Prior allergic episodes:  No prior episodes Context: food   Context comment:  Ate fettucine at [employer] also new detergent Relieved by:  Nothing Worsened by:  Nothing tried Ineffective treatments:  None tried   Past Medical History  Diagnosis Date  . GERD (gastroesophageal reflux disease)   . Thyroid disease   . Insomnia   . Bipolar 1 disorder   . Morbid obesity   . IBS (irritable bowel syndrome)   . Whiplash   . OSA (obstructive sleep apnea)   . Asthma    Past Surgical History  Procedure Laterality Date  . Cholecystectomy    . Tympanostomy tube placement     No family history on file. History  Substance Use Topics  . Smoking status: Never Smoker   . Smokeless tobacco: Not on file  . Alcohol Use: No   OB History    No data available     Review of Systems  HENT: Negative for trouble swallowing.   Respiratory: Negative for wheezing.   Skin: Positive for itching. Negative for rash.  All other systems reviewed and are negative.     Allergies  Amoxapine and related; Amoxicillin; Aspartame and phenylalanine; Dilaudid; Doxycycline; Flagyl; Keflex; Keppra; Milk-related compounds; Neurontin; Other; Prednisone; Pristiq; Provigil; Topamax; and Toradol  Home Medications   Prior to Admission medications   Medication Sig Start Date End Date Taking? Authorizing Provider   OXYGEN Inhale into the lungs. **With CPAP at night**   Yes Historical Provider, MD  clidinium-chlordiazePOXIDE (LIBRAX) 5-2.5 MG per capsule Take 1 capsule by mouth.    Historical Provider, MD  clonazePAM (KLONOPIN) 0.5 MG tablet Take 0.5 mg by mouth 3 (three) times daily. 1mg  at lunchtime only    Historical Provider, MD  Dexlansoprazole (DEXILANT PO) Take by mouth.    Historical Provider, MD  dicyclomine (BENTYL) 20 MG tablet Take 1 tablet (20 mg total) by mouth 2 (two) times daily. 09/04/14   Milderd Manocchio K Girlie Veltri-Rasch, MD  divalproex (DEPAKOTE) 500 MG DR tablet Take 500 mg by mouth at bedtime.     Historical Provider, MD  DULoxetine HCl (CYMBALTA PO) Take by mouth.    Historical Provider, MD  eszopiclone (LUNESTA) 2 MG TABS tablet Take 3 mg by mouth at bedtime as needed for sleep. Take immediately before bedtime    Historical Provider, MD  fluconazole (DIFLUCAN) 150 MG tablet Take 150 mg by mouth daily.    Historical Provider, MD  FLUoxetine HCl (PROZAC PO) Take by mouth.    Historical Provider, MD  Fluticasone Furoate-Vilanterol (BREO ELLIPTA) 100-25 MCG/INH AEPB Inhale into the lungs.    Historical Provider, MD  furosemide (LASIX) 20 MG tablet Take 2 tablets (40 mg total) by mouth daily. 05/03/14   Shon Batonourtney F Horton, MD  haloperidol (HALDOL) 5 MG tablet Take 5 mg by mouth 2 (two) times daily.    Historical Provider, MD  hydrOXYzine (ATARAX/VISTARIL) 25 MG tablet Take 1 tablet (25  mg total) by mouth every 4 (four) hours as needed for itching. 05/11/14   Carlisle Beers Molpus, MD  levalbuterol [facility] HFA) 45 MCG/ACT inhaler Inhale into the lungs every 4 (four) hours as needed for wheezing.    Historical Provider, MD  levothyroxine (SYNTHROID, LEVOTHROID) 75 MCG tablet Take 88 mcg by mouth daily before breakfast.     Historical Provider, MD  lithium 300 MG tablet Take 450 mg by mouth at bedtime.     Historical Provider, MD  loratadine (CLARITIN) 10 MG tablet Take 10 mg by mouth daily.    Historical Provider,  MD  Milnacipran (SAVELLA) 50 MG TABS tablet Take 50 mg by mouth 2 (two) times daily.    Historical Provider, MD  Mirabegron (MYRBETRIQ PO) Take by mouth.    Historical Provider, MD  mirabegron ER (MYRBETRIQ) 50 MG TB24 tablet Take 50 mg by mouth daily.    Historical Provider, MD  montelukast (SINGULAIR) 10 MG tablet Take 10 mg by mouth at bedtime.    Historical Provider, MD  nitrofurantoin, macrocrystal-monohydrate, (MACROBID) 100 MG capsule Take 1 capsule (100 mg total) by mouth 2 (two) times daily. X 7 days 09/04/14   Rosco Harriott K Emerick Weatherly-Rasch, MD  nitrofurantoin, macrocrystal-monohydrate, (MACROBID) 100 MG capsule Take 1 capsule (100 mg total) by mouth 2 (two) times daily. X 7 days 11/22/14   Bonita Brindisi K Seth Higginbotham-Rasch, MD  omeprazole (PRILOSEC) 20 MG capsule Take 1 capsule (20 mg total) by mouth daily. 07/22/14   Burtis Imhoff K Demeka Sutter-Rasch, MD  orphenadrine (NORFLEX) 100 MG tablet Take 1 tablet (100 mg total) by mouth 2 (two) times daily. 05/18/14   Enid Skeens, MD  oxyCODONE-acetaminophen (PERCOCET) 5-325 MG per tablet Take 1-2 tablets by mouth every 6 (six) hours as needed (for pain). 05/11/14   Carlisle Beers Molpus, MD  phenazopyridine (PYRIDIUM) 200 MG tablet Take 1 tablet (200 mg total) by mouth 3 (three) times daily. 09/04/14   Graceanne Guin K Markisha Meding-Rasch, MD  phenazopyridine (PYRIDIUM) 200 MG tablet Take 1 tablet (200 mg total) by mouth 3 (three) times daily. 11/22/14   Jaelen Gellerman K Armonie Staten-Rasch, MD  pindolol (VISKEN) 5 MG tablet Take 5 mg by mouth 2 (two) times daily.    Historical Provider, MD  Potassium Chloride CR (MICRO-K) 8 MEQ CPCR capsule CR Take 8 mEq by mouth.    Historical Provider, MD  primidone (MYSOLINE) 50 MG tablet Take by mouth 4 (four) times daily.    Historical Provider, MD  ramelteon (ROZEREM) 8 MG tablet Take 8 mg by mouth at bedtime.    Historical Provider, MD  ranitidine (ZANTAC) 300 MG tablet Take 300 mg by mouth at bedtime.    Historical Provider, MD  risperidone (RISPERDAL) 4 MG tablet Take 2  mg by mouth daily.     Historical Provider, MD  temazepam (RESTORIL) 30 MG capsule Take 45 mg by mouth at bedtime as needed for sleep.    Historical Provider, MD  terbinafine (LAMISIL AT) 1 % cream Apply to rash twice daily. 08/17/14   John L Molpus, MD  Zolpidem Tartrate (AMBIEN PO) Take by mouth.    Historical Provider, MD   BP 105/65 mmHg  Pulse 88  Temp(Src) 98.2 F (36.8 C) (Oral)  Resp 18  Ht  (1.6 m)  Wt 245 lb (111.131 kg)  BMI 43.41 kg/m2  SpO2 100% Physical Exam  Constitutional: She is oriented to person, place, and time. She appears well-developed and well-nourished. No distress.  HENT:  Head: Normocephalic and atraumatic.  Mouth/Throat:  Oropharynx is clear and moist.  Eyes: Conjunctivae and EOM are normal. Pupils are equal, round, and reactive to light.  Neck: Normal range of motion. Neck supple.  Cardiovascular: Normal rate, regular rhythm and intact distal pulses.   Pulmonary/Chest: Effort normal and breath sounds normal. No respiratory distress. She has no wheezes. She has no rales.  Abdominal: Soft. Bowel sounds are normal. There is no tenderness. There is no rebound and no guarding.  Musculoskeletal: Normal range of motion.  Neurological: She is alert and oriented to person, place, and time.  Skin: Skin is warm and dry.  Psychiatric: She has a normal mood and affect.    ED Course  Procedures (including critical care time) Labs Review Labs Reviewed - No data to display  Imaging Review No results found.   EKG Interpretation None      MDM   Final diagnoses:  None   Medications  famotidine (PEPCID) tablet 20 mg (20 mg Oral Given 02/01/15 2306)  dexamethasone (DECADRON) injection 10 mg (10 mg Intramuscular Given 02/02/15 0124)   Will start pepcid BID and medrol as patient is allergic to benadryl and prednisone.     Jasmine Awe, MD 02/02/15 [phone]

## 2015-02-06 ENCOUNTER — Encounter (HOSPITAL_BASED_OUTPATIENT_CLINIC_OR_DEPARTMENT_OTHER): Payer: Self-pay | Admitting: *Deleted

## 2015-02-06 ENCOUNTER — Emergency Department (HOSPITAL_BASED_OUTPATIENT_CLINIC_OR_DEPARTMENT_OTHER)
Admission: EM | Admit: 2015-02-06 | Discharge: 2015-02-06 | Disposition: A | Discharge status: Home / Self Care | Payer: No Typology Code available for payment source | Attending: Emergency Medicine | Admitting: Emergency Medicine

## 2015-02-06 DIAGNOSIS — F319 Bipolar disorder, unspecified: Secondary | ICD-10-CM | POA: Insufficient documentation

## 2015-02-06 DIAGNOSIS — Z7952 Long term (current) use of systemic steroids: Secondary | ICD-10-CM | POA: Diagnosis not present

## 2015-02-06 DIAGNOSIS — Z87828 Personal history of other (healed) physical injury and trauma: Secondary | ICD-10-CM | POA: Insufficient documentation

## 2015-02-06 DIAGNOSIS — G4733 Obstructive sleep apnea (adult) (pediatric): Secondary | ICD-10-CM | POA: Insufficient documentation

## 2015-02-06 DIAGNOSIS — Z7951 Long term (current) use of inhaled steroids: Secondary | ICD-10-CM | POA: Insufficient documentation

## 2015-02-06 DIAGNOSIS — K219 Gastro-esophageal reflux disease without esophagitis: Secondary | ICD-10-CM | POA: Insufficient documentation

## 2015-02-06 DIAGNOSIS — K589 Irritable bowel syndrome without diarrhea: Secondary | ICD-10-CM | POA: Diagnosis not present

## 2015-02-06 DIAGNOSIS — E079 Disorder of thyroid, unspecified: Secondary | ICD-10-CM | POA: Diagnosis not present

## 2015-02-06 DIAGNOSIS — Z792 Long term (current) use of antibiotics: Secondary | ICD-10-CM | POA: Diagnosis not present

## 2015-02-06 DIAGNOSIS — R202 Paresthesia of skin: Secondary | ICD-10-CM | POA: Insufficient documentation

## 2015-02-06 DIAGNOSIS — J45909 Unspecified asthma, uncomplicated: Secondary | ICD-10-CM | POA: Diagnosis not present

## 2015-02-06 DIAGNOSIS — R51 Headache: Secondary | ICD-10-CM | POA: Insufficient documentation

## 2015-02-06 DIAGNOSIS — Z9981 Dependence on supplemental oxygen: Secondary | ICD-10-CM | POA: Diagnosis not present

## 2015-02-06 DIAGNOSIS — Z880 Allergy status to penicillin: Secondary | ICD-10-CM | POA: Diagnosis not present

## 2015-02-06 DIAGNOSIS — R519 Headache, unspecified: Secondary | ICD-10-CM

## 2015-02-06 DIAGNOSIS — Z79899 Other long term (current) drug therapy: Secondary | ICD-10-CM | POA: Diagnosis not present

## 2015-02-06 MED ORDER — METHOCARBAMOL 500 MG PO TABS
ORAL_TABLET | ORAL | Status: AC
  Administered 2015-02-06: 1000 mg
  Filled 2015-02-06: qty 2

## 2015-02-06 MED ORDER — METHOCARBAMOL 1000 MG/10ML IJ SOLN: 1000.0000 mg | Freq: Once | INTRAMUSCULAR | Status: DC

## 2015-02-06 MED ORDER — METOCLOPRAMIDE HCL 5 MG/ML IJ SOLN
10.0000 mg | Freq: Once | INTRAMUSCULAR | Status: AC
  Administered 2015-02-06: 10 mg via INTRAMUSCULAR
  Filled 2015-02-06: qty 2

## 2015-02-06 MED ORDER — HYDROXYZINE HCL 25 MG PO TABS
50.0000 mg | ORAL_TABLET | Freq: Once | ORAL | Status: AC
  Administered 2015-02-06: 50 mg via ORAL
  Filled 2015-02-06: qty 2

## 2015-02-06 NOTE — ED Notes (Signed)
Hx of torticollis.  Reports that 'this episode started 3 hours ago'.  Pt requesting: reglan, atarax, robaxin and phenegran.

## 2015-02-06 NOTE — Discharge Instructions (Signed)
Please follow with your primary care doctor in the next 2 days for a check-up. They must obtain records for further management.  ° °Do not hesitate to return to the Emergency Department for any new, worsening or concerning symptoms.  ° ° °General Headache Without Cause °A general headache is pain or discomfort felt around the head or neck area. The cause may not be found.  °HOME CARE  °· Keep all doctor visits. °· Only take medicines as told by your doctor. °· Lie down in a dark, quiet room when you have a headache. °· Keep a journal to find out if certain things bring on headaches. For example, write down: °¨ What you eat and drink. °¨ How much sleep you get. °¨ Any change to your diet or medicines. °· Relax by getting a massage or doing other relaxing activities. °· Put ice or heat packs on the head and neck area as told by your doctor. °· Lessen stress. °· Sit up straight. Do not tighten (tense) your muscles. °· Quit smoking if you smoke. °· Lessen how much alcohol you drink. °· Lessen how much caffeine you drink, or stop drinking caffeine. °· Eat and sleep on a regular schedule. °· Get 7 to 9 hours of sleep, or as told by your doctor. °· Keep lights dim if bright lights bother you or make your headaches worse. °GET HELP RIGHT AWAY IF:  °· Your headache becomes really bad. °· You have a fever. °· You have a stiff neck. °· You have trouble seeing. °· Your muscles are weak, or you lose muscle control. °· You lose your balance or have trouble walking. °· You feel like you will pass out (faint), or you pass out. °· You have really bad symptoms that are different than your first symptoms. °· You have problems with the medicines given to you by your doctor. °· Your medicines do not work. °· Your headache feels different than the other headaches. °· You feel sick to your stomach (nauseous) or throw up (vomit). °MAKE SURE YOU:  °· Understand these instructions. °· Will watch your condition. °· Will get help right away if  you are not doing well or get worse. °Document Released: 08/25/2008 Document Revised: 02/08/2012 Document Reviewed: 11/06/2011 °[employer]® Patient Information ©2015 [employer]. This information is not intended to replace advice given to you by your health care provider. Make sure you discuss any questions you have with your health care provider. ° °

## 2015-02-06 NOTE — ED Provider Notes (Signed)
CSN: [id number]     Arrival date & time 02/06/15  1859 History   First MD Initiated Contact with Patient 02/06/15 2058     Chief Complaint  Patient presents with  . Torticollis     (Consider location/radiation/quality/duration/timing/severity/associated sxs/prior Treatment) HPI  Jessica Nicholson is a 39 y.o. female complaining of migraine exacerbation onset 3 hours ago it is right periorbital and extends to the right lateral neck. The difference in his headaches and from her standard is that she has a perioral paresthesia. She took oral tractions at home with little relief. She follows with a neurologist at [facility]. Pt denies fever, rash, confusion, cervicalgia, LOC/syncope, change in vision, N/V, numbness, weakness, dysarthria, ataxia, thunderclap onset, exacerbation with exertion or valsalva, exacerbation in morning, CP, SOB, abdominal pain.   Past Medical History  Diagnosis Date  . GERD (gastroesophageal reflux disease)   . Thyroid disease   . Bipolar 1 disorder   . Morbid obesity   . IBS (irritable bowel syndrome)   . Whiplash   . OSA (obstructive sleep apnea)   . Asthma    Past Surgical History  Procedure Laterality Date  . Cholecystectomy    . Tympanostomy tube placement     History reviewed. No pertinent family history. History  Substance Use Topics  . Smoking status: Never Smoker   . Smokeless tobacco: Not on file  . Alcohol Use: No   OB History    No data available     Review of Systems  10 systems reviewed and found to be negative, except as noted in the HPI.   Allergies  Amoxapine and related; Amoxicillin; Aspartame and phenylalanine; Dilaudid; Doxycycline; Flagyl; Keflex; Keppra; Milk-related compounds; Neurontin; Other; Prednisone; Pristiq; Provigil; Topamax; and Toradol  Home Medications   Prior to Admission medications   Medication Sig Start Date End Date Taking? Authorizing Provider  clidinium-chlordiazePOXIDE (LIBRAX) 5-2.5 MG per capsule Take 1  capsule by mouth.    Historical Provider, MD  clonazePAM (KLONOPIN) 0.5 MG tablet Take 0.5 mg by mouth 3 (three) times daily.  at lunchtime only    Historical Provider, MD  Dexlansoprazole (DEXILANT PO) Take by mouth.    Historical Provider, MD  dicyclomine (BENTYL) 20 MG tablet Take 1 tablet (20 mg total) by mouth 2 (two) times daily. 09/04/14   April Palumbo, MD  divalproex (DEPAKOTE) 500 MG DR tablet Take 500 mg by mouth at bedtime.     Historical Provider, MD  DULoxetine HCl (CYMBALTA PO) Take by mouth.    Historical Provider, MD  eszopiclone (LUNESTA) 2 MG TABS tablet Take 3 mg by mouth at bedtime as needed for sleep. Take immediately before bedtime    Historical Provider, MD  famotidine (PEPCID) 20 MG tablet Take 1 tablet (20 mg total) by mouth 2 (two) times daily. 02/02/15   April Palumbo, MD  fluconazole (DIFLUCAN) 150 MG tablet Take 150 mg by mouth daily.    Historical Provider, MD  FLUoxetine HCl (PROZAC PO) Take by mouth.    Historical Provider, MD  Fluticasone Furoate-Vilanterol (BREO ELLIPTA) 100-25 MCG/INH AEPB Inhale into the lungs.    Historical Provider, MD  furosemide (LASIX) 20 MG tablet Take 2 tablets (40 mg total) by mouth daily. 05/03/14   Shon Baton, MD  haloperidol (HALDOL) 5 MG tablet Take 5 mg by mouth 2 (two) times daily.    Historical Provider, MD  hydrOXYzine (ATARAX/VISTARIL) 25 MG tablet Take 1 tablet (25 mg total) by mouth every 4 (four) hours as needed  for itching. 05/11/14   John Molpus, MD  levalbuterol [facility](XOPENEX HFA) 45 MCG/ACT inhaler Inhale into the lungs every 4 (four) hours as needed for wheezing.    Historical Provider, MD  levothyroxine (SYNTHROID, LEVOTHROID) 75 MCG tablet Take 88 mcg by mouth daily before breakfast.     Historical Provider, MD  lithium 300 MG tablet Take 450 mg by mouth at bedtime.     Historical Provider, MD  loratadine (CLARITIN) 10 MG tablet Take 10 mg by mouth daily.    Historical Provider, MD  methylPREDNIsolone (MEDROL DOSPACK) 4  MG tablet follow package directions 02/02/15   April Palumbo, MD  Milnacipran (SAVELLA) 50 MG TABS tablet Take 50 mg by mouth 2 (two) times daily.    Historical Provider, MD  Mirabegron (MYRBETRIQ PO) Take by mouth.    Historical Provider, MD  mirabegron ER (MYRBETRIQ) 50 MG TB24 tablet Take 50 mg by mouth daily.    Historical Provider, MD  montelukast (SINGULAIR) 10 MG tablet Take 10 mg by mouth at bedtime.    Historical Provider, MD  nitrofurantoin, macrocrystal-monohydrate, (MACROBID) 100 MG capsule Take 1 capsule (100 mg total) by mouth 2 (two) times daily. X 7 days 09/04/14   April Palumbo, MD  nitrofurantoin, macrocrystal-monohydrate, (MACROBID) 100 MG capsule Take 1 capsule (100 mg total) by mouth 2 (two) times daily. X 7 days 11/22/14   April Palumbo, MD  omeprazole (PRILOSEC) 20 MG capsule Take 1 capsule (20 mg total) by mouth daily. 07/22/14   April Palumbo, MD  orphenadrine (NORFLEX) 100 MG tablet Take 1 tablet (100 mg total) by mouth 2 (two) times daily. 05/18/14   Blane OharaJoshua Zavitz, MD  oxyCODONE-acetaminophen (PERCOCET) 5-325 MG per tablet Take 1-2 tablets by mouth every 6 (six) hours as needed (for pain). 05/11/14   Paula LibraJohn Molpus, MD  OXYGEN Inhale into the lungs. **With CPAP at night**    Historical Provider, MD  phenazopyridine (PYRIDIUM) 200 MG tablet Take 1 tablet (200 mg total) by mouth 3 (three) times daily. 09/04/14   April Palumbo, MD  phenazopyridine (PYRIDIUM) 200 MG tablet Take 1 tablet (200 mg total) by mouth 3 (three) times daily. 11/22/14   April Palumbo, MD  pindolol (VISKEN) 5 MG tablet Take 5 mg by mouth 2 (two) times daily.    Historical Provider, MD  Potassium Chloride CR (MICRO-K) 8 MEQ CPCR capsule CR Take 8 mEq by mouth.    Historical Provider, MD  primidone (MYSOLINE) 50 MG tablet Take by mouth 4 (four) times daily.    Historical Provider, MD  ramelteon (ROZEREM) 8 MG tablet Take 8 mg by mouth at bedtime.    Historical Provider, MD  ranitidine (ZANTAC) 300 MG tablet Take 300  mg by mouth at bedtime.    Historical Provider, MD  risperidone (RISPERDAL) 4 MG tablet Take 2 mg by mouth daily.     Historical Provider, MD  temazepam (RESTORIL) 30 MG capsule Take 45 mg by mouth at bedtime as needed for sleep.    Historical Provider, MD  terbinafine (LAMISIL AT) 1 % cream Apply to rash twice daily. 08/17/14   John Molpus, MD  Zolpidem Tartrate (AMBIEN PO) Take by mouth.    Historical Provider, MD   BP 145/87 mmHg  Pulse 88  Temp(Src) 99.5 F (37.5 C) (Oral)  Resp 18  Ht 5\' 2"  (1.575 m)  Wt 250 lb (113.399 kg)  BMI 45.71 kg/m2  SpO2 97% Physical Exam  Constitutional: She is oriented to person, place, and time. She appears well-developed  and well-nourished.  HENT:  Head: Normocephalic and atraumatic.  Mouth/Throat: Oropharynx is clear and moist.  Eyes: Conjunctivae and EOM are normal. Pupils are equal, round, and reactive to light.  Neck: Normal range of motion. Neck supple.  FROM to C-spine. Pt can touch chin to chest without discomfort. No TTP of midline cervical spine.   Cardiovascular: Normal rate, regular rhythm and intact distal pulses.   Pulmonary/Chest: Effort normal and breath sounds normal. No respiratory distress. She has no wheezes. She has no rales. She exhibits no tenderness.  Abdominal: Soft. Bowel sounds are normal. There is no tenderness.  Musculoskeletal: Normal range of motion. She exhibits no edema or tenderness.  Neurological: She is alert and oriented to person, place, and time. No cranial nerve deficit.  II-Visual fields grossly intact. III/IV/VI-Extraocular movements intact.  Pupils reactive bilaterally. V/VII-Smile symmetric, equal eyebrow raise,  facial sensation intact VIII- Hearing grossly intact IX/X-Normal gag XI-bilateral shoulder shrug XII-midline tongue extension Motor: 5/5 bilaterally with normal tone and bulk Cerebellar: Normal finger-to-nose  and normal heel-to-shin test.   Romberg negative Ambulates with a coordinated gait    Nursing note and vitals reviewed.   ED Course  Procedures (including critical care time) Labs Review Labs Reviewed - No data to display  Imaging Review No results found.   EKG Interpretation None      MDM   Final diagnoses:  Nonintractable headache, unspecified chronicity pattern, unspecified headache type    Filed Vitals:   02/06/15 1910 02/06/15 2158  BP: 148/94 145/87  Pulse: 96 88  Temp: 98.6 F (37 C) 99.5 F (37.5 C)  TempSrc: Oral Oral  Resp: 18 18  Height:  (1.575 m)   Weight: 250 lb (113.399 kg)   SpO2: 95% 97%    Medications  metoCLOPramide (REGLAN) injection 10 mg (10 mg Intramuscular Given 02/06/15 2139)  hydrOXYzine (ATARAX/VISTARIL) tablet 50 mg (50 mg Oral Given 02/06/15 2138)  methocarbamol (ROBAXIN) 500 MG tablet (1,000 mg  Given 02/06/15 2137)    Jessica Nicholson is a pleasant 39 y.o. female presenting with HA. Presentation is like pts typical HA and non concerning for [facility], ICH, Meningitis, or temporal arteritis. Pt is afebrile with no focal neuro deficits, nuchal rigidity, or change in vision. Pt is to follow up with PCP to discuss prophylactic medication. Pt verbalizes understanding and is agreeable with plan to dc.  Evaluation does not show pathology that would require ongoing emergent intervention or inpatient treatment. Pt is hemodynamically stable and mentating appropriately. Discussed findings and plan with patient/guardian, who agrees with care plan. All questions answered. Return precautions discussed and outpatient follow up given.        Wynetta Emery, PA-C 02/06/15 2248  Elwin Mocha, MD 02/06/15 [phone]

## 2015-02-10 ENCOUNTER — Emergency Department (HOSPITAL_BASED_OUTPATIENT_CLINIC_OR_DEPARTMENT_OTHER)
Admission: EM | Admit: 2015-02-10 | Discharge: 2015-02-10 | Disposition: A | Discharge status: Home / Self Care | Payer: No Typology Code available for payment source | Attending: Emergency Medicine | Admitting: Emergency Medicine

## 2015-02-10 ENCOUNTER — Encounter (HOSPITAL_BASED_OUTPATIENT_CLINIC_OR_DEPARTMENT_OTHER): Payer: Self-pay | Admitting: *Deleted

## 2015-02-10 DIAGNOSIS — J45909 Unspecified asthma, uncomplicated: Secondary | ICD-10-CM | POA: Insufficient documentation

## 2015-02-10 DIAGNOSIS — Z8669 Personal history of other diseases of the nervous system and sense organs: Secondary | ICD-10-CM | POA: Insufficient documentation

## 2015-02-10 DIAGNOSIS — F319 Bipolar disorder, unspecified: Secondary | ICD-10-CM | POA: Diagnosis not present

## 2015-02-10 DIAGNOSIS — K589 Irritable bowel syndrome without diarrhea: Secondary | ICD-10-CM | POA: Insufficient documentation

## 2015-02-10 DIAGNOSIS — Z880 Allergy status to penicillin: Secondary | ICD-10-CM | POA: Diagnosis not present

## 2015-02-10 DIAGNOSIS — Z87828 Personal history of other (healed) physical injury and trauma: Secondary | ICD-10-CM | POA: Insufficient documentation

## 2015-02-10 DIAGNOSIS — R197 Diarrhea, unspecified: Secondary | ICD-10-CM | POA: Diagnosis present

## 2015-02-10 DIAGNOSIS — E079 Disorder of thyroid, unspecified: Secondary | ICD-10-CM | POA: Diagnosis not present

## 2015-02-10 DIAGNOSIS — K219 Gastro-esophageal reflux disease without esophagitis: Secondary | ICD-10-CM | POA: Diagnosis not present

## 2015-02-10 DIAGNOSIS — K529 Noninfective gastroenteritis and colitis, unspecified: Secondary | ICD-10-CM

## 2015-02-10 DIAGNOSIS — Z79899 Other long term (current) drug therapy: Secondary | ICD-10-CM | POA: Diagnosis not present

## 2015-02-10 MED ORDER — DIPHENOXYLATE-ATROPINE 2.5-0.025 MG PO TABS: 1.0000 | ORAL_TABLET | Freq: Four times a day (QID) | ORAL | Status: DC | PRN

## 2015-02-10 MED ORDER — ONDANSETRON HCL 4 MG/2ML IJ SOLN
4.0000 mg | Freq: Once | INTRAMUSCULAR | Status: AC
  Administered 2015-02-10: 4 mg via INTRAVENOUS
  Filled 2015-02-10: qty 2

## 2015-02-10 MED ORDER — PROMETHAZINE HCL 25 MG PO TABS: 25.0000 mg | ORAL_TABLET | Freq: Four times a day (QID) | ORAL | Status: DC | PRN

## 2015-02-10 MED ORDER — SODIUM CHLORIDE 0.9 % IV BOLUS (SEPSIS)
1000.0000 mL | Freq: Once | INTRAVENOUS | Status: AC
  Administered 2015-02-10: 1000 mL via INTRAVENOUS

## 2015-02-10 MED ORDER — LORAZEPAM 2 MG/ML IJ SOLN
0.5000 mg | INTRAMUSCULAR | Status: DC | PRN
  Administered 2015-02-10: 0.5 mg via INTRAVENOUS
  Filled 2015-02-10: qty 1

## 2015-02-10 MED ORDER — ONDANSETRON 4 MG PO TBDP: 4.0000 mg | ORAL_TABLET | Freq: Three times a day (TID) | ORAL | Status: DC | PRN

## 2015-02-10 MED ORDER — PROMETHAZINE HCL 25 MG/ML IJ SOLN
25.0000 mg | Freq: Once | INTRAMUSCULAR | Status: AC
  Administered 2015-02-10: 25 mg via INTRAVENOUS

## 2015-02-10 MED ORDER — DIPHENOXYLATE-ATROPINE 2.5-0.025 MG PO TABS
ORAL_TABLET | ORAL | Status: AC
  Filled 2015-02-10: qty 2

## 2015-02-10 MED ORDER — DIPHENOXYLATE-ATROPINE 2.5-0.025 MG PO TABS
2.0000 | ORAL_TABLET | Freq: Once | ORAL | Status: AC
  Administered 2015-02-10: 2 via ORAL
  Filled 2015-02-10: qty 2

## 2015-02-10 MED ORDER — METOCLOPRAMIDE HCL 5 MG/ML IJ SOLN
10.0000 mg | Freq: Once | INTRAMUSCULAR | Status: AC
  Administered 2015-02-10: 10 mg via INTRAVENOUS

## 2015-02-10 NOTE — ED Provider Notes (Signed)
Patient reevaluated. States her nausea is "better", but she still feels mildly nauseated. She went to the restroom "I still have diarrhea". Discussed with her that this is very likely a viral process. Her symptoms may persist over a few days. We will attempt symptom control is some additional Zofran here. Plan will be discharge home, rest, clear liquids, slowly advancing to brat diet. When necessary Zofran or Phenergan. When necessary Lomotil. Return if acute changes. Otherwise, primary care follow-up.  Rolland PorterMark Maud Rubendall, MD 02/10/15 [phone]

## 2015-02-10 NOTE — ED Notes (Signed)
Patient vomited 1st doses of lomotil. Additional zofran given and patient sipping gingerale.

## 2015-02-10 NOTE — ED Notes (Signed)
Nausea returning, EDP aware.

## 2015-02-10 NOTE — ED Notes (Signed)
C/o nvd and abd pain, onset 0400, rates pain 5/10, reports ate a burger earlier tonight at 1900, tried zofran at 0415 w/o change or relief, last BM 0515 (diarrhea, soft, brown), (denies: fever, bleeding or sob). Alert, NAD, calm.

## 2015-02-10 NOTE — Discharge Instructions (Signed)

## 2015-02-10 NOTE — ED Provider Notes (Signed)
CSN: [id number]     Arrival date & time 02/10/15  0541 History   None    Chief Complaint  Patient presents with  . Vomiting & Diarrhea      (Consider location/radiation/quality/duration/timing/severity/associated sxs/prior Treatment) HPI  This is a 39 year old female who had the sudden onset of nausea and vomiting that awakened her from sleep about 2-3 hours ago. She has had multiple episodes of emesis. This been accompanied by abdominal cramping which is not severe. She is also having diarrhea. She has not had a fever. She ate at a restaurant last night with her husband; he has not developed any similar symptoms. She tried taking Zofran prior to arrival without relief for nausea.  Past Medical History  Diagnosis Date  . GERD (gastroesophageal reflux disease)   . Thyroid disease   . Bipolar 1 disorder   . Morbid obesity   . IBS (irritable bowel syndrome)   . Whiplash   . OSA (obstructive sleep apnea)   . Asthma    Past Surgical History  Procedure Laterality Date  . Cholecystectomy    . Tympanostomy tube placement     No family history on file. History  Substance Use Topics  . Smoking status: Never Smoker   . Smokeless tobacco: Not on file  . Alcohol Use: No   OB History    No data available     Review of Systems  All other systems reviewed and are negative.   Allergies  Amoxapine and related; Amoxicillin; Aspartame and phenylalanine; Dilaudid; Doxycycline; Flagyl; Keflex; Keppra; Milk-related compounds; Neurontin; Other; Prednisone; Pristiq; Provigil; Topamax; and Toradol  Home Medications   Prior to Admission medications   Medication Sig Start Date End Date Taking? Authorizing Provider  clidinium-chlordiazePOXIDE (LIBRAX) 5-2.5 MG per capsule Take 1 capsule by mouth.    Historical Provider, MD  clonazePAM (KLONOPIN) 0.5 MG tablet Take 0.5 mg by mouth 3 (three) times daily. 1mg  at lunchtime only    Historical Provider, MD  Dexlansoprazole (DEXILANT PO) Take by  mouth.    Historical Provider, MD  dicyclomine (BENTYL) 20 MG tablet Take 1 tablet (20 mg total) by mouth 2 (two) times daily. 09/04/14   April Palumbo, MD  divalproex (DEPAKOTE) 500 MG DR tablet Take 500 mg by mouth at bedtime.     Historical Provider, MD  DULoxetine HCl (CYMBALTA PO) Take by mouth.    Historical Provider, MD  eszopiclone (LUNESTA) 2 MG TABS tablet Take 3 mg by mouth at bedtime as needed for sleep. Take immediately before bedtime    Historical Provider, MD  famotidine (PEPCID) 20 MG tablet Take 1 tablet (20 mg total) by mouth 2 (two) times daily. 02/02/15   April Palumbo, MD  fluconazole (DIFLUCAN) 150 MG tablet Take 150 mg by mouth daily.    Historical Provider, MD  FLUoxetine HCl (PROZAC PO) Take by mouth.    Historical Provider, MD  Fluticasone Furoate-Vilanterol (BREO ELLIPTA) 100-25 MCG/INH AEPB Inhale into the lungs.    Historical Provider, MD  furosemide (LASIX) 20 MG tablet Take 2 tablets (40 mg total) by mouth daily. 05/03/14   Shon Batonourtney F Horton, MD  haloperidol (HALDOL) 5 MG tablet Take 5 mg by mouth 2 (two) times daily.    Historical Provider, MD  hydrOXYzine (ATARAX/VISTARIL) 25 MG tablet Take 1 tablet (25 mg total) by mouth every 4 (four) hours as needed for itching. 05/11/14   Aristotelis Vilardi, MD  levalbuterol [facility](XOPENEX HFA) 45 MCG/ACT inhaler Inhale into the lungs every 4 (four) hours  as needed for wheezing.    Historical Provider, MD  levothyroxine (SYNTHROID, LEVOTHROID) 75 MCG tablet Take 88 mcg by mouth daily before breakfast.     Historical Provider, MD  lithium 300 MG tablet Take 450 mg by mouth at bedtime.     Historical Provider, MD  loratadine (CLARITIN) 10 MG tablet Take 10 mg by mouth daily.    Historical Provider, MD  methylPREDNIsolone (MEDROL DOSPACK) 4 MG tablet follow package directions 02/02/15   April Palumbo, MD  Milnacipran (SAVELLA) 50 MG TABS tablet Take 50 mg by mouth 2 (two) times daily.    Historical Provider, MD  Mirabegron (MYRBETRIQ PO) Take by mouth.     Historical Provider, MD  mirabegron ER (MYRBETRIQ) 50 MG TB24 tablet Take 50 mg by mouth daily.    Historical Provider, MD  montelukast (SINGULAIR) 10 MG tablet Take 10 mg by mouth at bedtime.    Historical Provider, MD  nitrofurantoin, macrocrystal-monohydrate, (MACROBID) 100 MG capsule Take 1 capsule (100 mg total) by mouth 2 (two) times daily. X 7 days 09/04/14   April Palumbo, MD  nitrofurantoin, macrocrystal-monohydrate, (MACROBID) 100 MG capsule Take 1 capsule (100 mg total) by mouth 2 (two) times daily. X 7 days 11/22/14   April Palumbo, MD  omeprazole (PRILOSEC) 20 MG capsule Take 1 capsule (20 mg total) by mouth daily. 07/22/14   April Palumbo, MD  orphenadrine (NORFLEX) 100 MG tablet Take 1 tablet (100 mg total) by mouth 2 (two) times daily. 05/18/14   Blane Ohara, MD  oxyCODONE-acetaminophen (PERCOCET) 5-325 MG per tablet Take 1-2 tablets by mouth every 6 (six) hours as needed (for pain). 05/11/14   Paula Libra, MD  OXYGEN Inhale into the lungs. **With CPAP at night**    Historical Provider, MD  phenazopyridine (PYRIDIUM) 200 MG tablet Take 1 tablet (200 mg total) by mouth 3 (three) times daily. 09/04/14   April Palumbo, MD  phenazopyridine (PYRIDIUM) 200 MG tablet Take 1 tablet (200 mg total) by mouth 3 (three) times daily. 11/22/14   April Palumbo, MD  pindolol (VISKEN) 5 MG tablet Take 5 mg by mouth 2 (two) times daily.    Historical Provider, MD  Potassium Chloride CR (MICRO-K) 8 MEQ CPCR capsule CR Take 8 mEq by mouth.    Historical Provider, MD  primidone (MYSOLINE) 50 MG tablet Take by mouth 4 (four) times daily.    Historical Provider, MD  ramelteon (ROZEREM) 8 MG tablet Take 8 mg by mouth at bedtime.    Historical Provider, MD  ranitidine (ZANTAC) 300 MG tablet Take 300 mg by mouth at bedtime.    Historical Provider, MD  risperidone (RISPERDAL) 4 MG tablet Take 2 mg by mouth daily.     Historical Provider, MD  temazepam (RESTORIL) 30 MG capsule Take 45 mg by mouth at bedtime as  needed for sleep.    Historical Provider, MD  terbinafine (LAMISIL AT) 1 % cream Apply to rash twice daily. 08/17/14   Winnell Bento, MD  Zolpidem Tartrate (AMBIEN PO) Take by mouth.    Historical Provider, MD   BP 131/83 mmHg  Pulse 91  Temp(Src) 98.7 F (37.1 C) (Oral)  Resp 18  Ht  (1.575 m)  Wt 245 lb (111.131 kg)  BMI 44.80 kg/m2  SpO2 97%   Physical Exam  General: Well-developed, well-nourished female in no acute distress; appearance consistent with age of record; intermittently retching HENT: normocephalic; atraumatic Eyes: pupils equal, round and reactive to light; extraocular muscles intact Neck: supple Heart:  regular rate and rhythm Lungs: clear to auscultation bilaterally Abdomen: soft; nondistended; nontender; no masses or hepatosplenomegaly; bowel sounds present Extremities: No deformity; full range of motion; pulses normal Neurologic: Awake, alert and oriented; motor function intact in all extremities and symmetric; no facial droop Skin: Warm and dry Psychiatric: Normal mood and affect    ED Course  Procedures (including critical care time)   MDM  7:05 AM Patient continues to be nauseated despite Phenergan 25 milligrams IV. We'll administer Reglan 10 milligrams IV. Dr. Fayrene Fearing will follow up and make disposition.   Paula Libra, MD 02/10/15 [phone]

## 2015-02-10 NOTE — ED Notes (Signed)
MD at bedside for re-evaluation.

## 2015-02-10 NOTE — ED Notes (Signed)
Received report from Al CorpusJon Cook RN

## 2015-02-10 NOTE — ED Notes (Signed)
Pt vomiting again.

## 2015-03-13 ENCOUNTER — Encounter (HOSPITAL_BASED_OUTPATIENT_CLINIC_OR_DEPARTMENT_OTHER): Payer: Self-pay | Admitting: Emergency Medicine

## 2015-03-13 ENCOUNTER — Emergency Department (HOSPITAL_BASED_OUTPATIENT_CLINIC_OR_DEPARTMENT_OTHER)
Admission: EM | Admit: 2015-03-13 | Discharge: 2015-03-13 | Disposition: A | Discharge status: Home / Self Care | Payer: No Typology Code available for payment source | Attending: Emergency Medicine | Admitting: Emergency Medicine

## 2015-03-13 DIAGNOSIS — K589 Irritable bowel syndrome without diarrhea: Secondary | ICD-10-CM | POA: Diagnosis not present

## 2015-03-13 DIAGNOSIS — Z8669 Personal history of other diseases of the nervous system and sense organs: Secondary | ICD-10-CM | POA: Diagnosis not present

## 2015-03-13 DIAGNOSIS — Z79899 Other long term (current) drug therapy: Secondary | ICD-10-CM | POA: Insufficient documentation

## 2015-03-13 DIAGNOSIS — J45909 Unspecified asthma, uncomplicated: Secondary | ICD-10-CM | POA: Insufficient documentation

## 2015-03-13 DIAGNOSIS — R200 Anesthesia of skin: Secondary | ICD-10-CM | POA: Diagnosis present

## 2015-03-13 DIAGNOSIS — K219 Gastro-esophageal reflux disease without esophagitis: Secondary | ICD-10-CM | POA: Insufficient documentation

## 2015-03-13 DIAGNOSIS — Z880 Allergy status to penicillin: Secondary | ICD-10-CM | POA: Diagnosis not present

## 2015-03-13 DIAGNOSIS — E079 Disorder of thyroid, unspecified: Secondary | ICD-10-CM | POA: Diagnosis not present

## 2015-03-13 DIAGNOSIS — R202 Paresthesia of skin: Secondary | ICD-10-CM | POA: Diagnosis not present

## 2015-03-13 DIAGNOSIS — F319 Bipolar disorder, unspecified: Secondary | ICD-10-CM | POA: Insufficient documentation

## 2015-03-13 NOTE — ED Notes (Signed)
Pt states went to PCP yesterday with neck spasms. History of same. Pt states now having numbness in both legs. Pt ambulatory. Pt states she took muscle relaxer prescribed by PCP around 8pm last night. Here due to leg numbness.

## 2015-03-13 NOTE — ED Provider Notes (Signed)
CSN: [id number]     Arrival date & time 03/13/15  0342 History   First MD Initiated Contact with Patient 03/13/15 [phone]     Chief Complaint  Patient presents with  . Numbness     (Consider location/radiation/quality/duration/timing/severity/associated sxs/prior Treatment) HPI  This is a 39 year old female with a history of recurrent neck spasms and psychiatric illness. She had an exacerbation of her neck pain and was seen by her PCP yesterday. She was given an injection of dexamethasone and was started on Skelaxin. About 1:00 this morning she developed a sensation of numbness in her lower legs. The numbness is in a stocking pattern involves her legs from the knees down. Her legs are not completely insensate but when she walks she gets the sense that her legs might give out. She is not having any upper extremity numbness. The spasms in her neck are not severe at the present time.  Past Medical History  Diagnosis Date  . GERD (gastroesophageal reflux disease)   . Thyroid disease   . Bipolar 1 disorder   . Morbid obesity   . IBS (irritable bowel syndrome)   . Whiplash   . OSA (obstructive sleep apnea)   . Asthma    Past Surgical History  Procedure Laterality Date  . Cholecystectomy    . Tympanostomy tube placement     No family history on file. History  Substance Use Topics  . Smoking status: Never Smoker   . Smokeless tobacco: Not on file  . Alcohol Use: No   OB History    No data available     Review of Systems  All other systems reviewed and are negative.   Allergies  Amoxapine and related; Amoxicillin; Aspartame and phenylalanine; Dilaudid; Doxycycline; Flagyl; Keflex; Keppra; Milk-related compounds; Neurontin; Other; Prednisone; Pristiq; Provigil; Topamax; and Toradol  Home Medications   Prior to Admission medications   Medication Sig Start Date End Date Taking? Authorizing Provider  clidinium-chlordiazePOXIDE (LIBRAX) 5-2.5 MG per capsule Take 1 capsule by mouth.    Yes Historical Provider, MD  clonazePAM (KLONOPIN) 0.5 MG tablet Take 0.5 mg by mouth 3 (three) times daily.  at lunchtime only   Yes Historical Provider, MD  Dexlansoprazole (DEXILANT PO) Take by mouth.   Yes Historical Provider, MD  divalproex (DEPAKOTE) 500 MG DR tablet Take 500 mg by mouth at bedtime.    Yes Historical Provider, MD  DULoxetine HCl (CYMBALTA PO) Take by mouth.   Yes Historical Provider, MD  eszopiclone (LUNESTA) 2 MG TABS tablet Take 3 mg by mouth at bedtime as needed for sleep. Take immediately before bedtime   Yes Historical Provider, MD  furosemide (LASIX) 20 MG tablet Take 2 tablets (40 mg total) by mouth daily. 05/03/14  Yes Shon Baton, MD  hydrOXYzine (ATARAX/VISTARIL) 25 MG tablet Take 1 tablet (25 mg total) by mouth every 4 (four) hours as needed for itching. 05/11/14  Yes Glenna Brunkow, MD  levalbuterol [facility] HFA) 45 MCG/ACT inhaler Inhale into the lungs every 4 (four) hours as needed for wheezing.   Yes Historical Provider, MD  levothyroxine (SYNTHROID, LEVOTHROID) 75 MCG tablet Take 88 mcg by mouth daily before breakfast.    Yes Historical Provider, MD  loratadine (CLARITIN) 10 MG tablet Take 10 mg by mouth daily.   Yes Historical Provider, MD  Mirabegron (MYRBETRIQ PO) Take by mouth.   Yes Historical Provider, MD  mirabegron ER (MYRBETRIQ) 50 MG TB24 tablet Take 50 mg by mouth daily.   Yes Historical Provider, MD  montelukast (SINGULAIR) 10 MG tablet Take 10 mg by mouth at bedtime.   Yes Historical Provider, MD  pindolol (VISKEN) 5 MG tablet Take 5 mg by mouth 2 (two) times daily.   Yes Historical Provider, MD  Potassium Chloride CR (MICRO-K) 8 MEQ CPCR capsule CR Take 8 mEq by mouth.   Yes Historical Provider, MD  primidone (MYSOLINE) 50 MG tablet Take by mouth 4 (four) times daily.   Yes Historical Provider, MD  promethazine (PHENERGAN) 25 MG tablet Take 1 tablet (25 mg total) by mouth every 6 (six) hours as needed for nausea or vomiting. 02/10/15  Yes Rolland PorterMark  James, MD  ranitidine (ZANTAC) 300 MG tablet Take 300 mg by mouth at bedtime.   Yes Historical Provider, MD  risperidone (RISPERDAL) 4 MG tablet Take 2 mg by mouth daily.    Yes Historical Provider, MD  Zolpidem Tartrate (AMBIEN PO) Take by mouth.   Yes Historical Provider, MD  dicyclomine (BENTYL) 20 MG tablet Take 1 tablet (20 mg total) by mouth 2 (two) times daily. 09/04/14   April Palumbo, MD  diphenoxylate-atropine (LOMOTIL) 2.5-0.025 MG per tablet Take 1 tablet by mouth 4 (four) times daily as needed for diarrhea or loose stools. 02/10/15   Rolland PorterMark James, MD  famotidine (PEPCID) 20 MG tablet Take 1 tablet (20 mg total) by mouth 2 (two) times daily. 02/02/15   April Palumbo, MD  fluconazole (DIFLUCAN) 150 MG tablet Take 150 mg by mouth daily.    Historical Provider, MD  FLUoxetine HCl (PROZAC PO) Take by mouth.    Historical Provider, MD  Fluticasone Furoate-Vilanterol (BREO ELLIPTA) 100-25 MCG/INH AEPB Inhale into the lungs.    Historical Provider, MD  methylPREDNIsolone (MEDROL DOSPACK) 4 MG tablet follow package directions 02/02/15   April Palumbo, MD  nitrofurantoin, macrocrystal-monohydrate, (MACROBID) 100 MG capsule Take 1 capsule (100 mg total) by mouth 2 (two) times daily. X 7 days 09/04/14   April Palumbo, MD  nitrofurantoin, macrocrystal-monohydrate, (MACROBID) 100 MG capsule Take 1 capsule (100 mg total) by mouth 2 (two) times daily. X 7 days 11/22/14   April Palumbo, MD  omeprazole (PRILOSEC) 20 MG capsule Take 1 capsule (20 mg total) by mouth daily. 07/22/14   April Palumbo, MD  ondansetron (ZOFRAN ODT) 4 MG disintegrating tablet Take 1 tablet (4 mg total) by mouth every 8 (eight) hours as needed for nausea. 02/10/15   Rolland PorterMark James, MD  orphenadrine (NORFLEX) 100 MG tablet Take 1 tablet (100 mg total) by mouth 2 (two) times daily. 05/18/14   Blane OharaJoshua Zavitz, MD  oxyCODONE-acetaminophen (PERCOCET) 5-325 MG per tablet Take 1-2 tablets by mouth every 6 (six) hours as needed (for pain). 05/11/14   Paula LibraJohn  Sanjuan Sawa, MD  OXYGEN Inhale into the lungs. **With CPAP at night**    Historical Provider, MD  phenazopyridine (PYRIDIUM) 200 MG tablet Take 1 tablet (200 mg total) by mouth 3 (three) times daily. 09/04/14   April Palumbo, MD  phenazopyridine (PYRIDIUM) 200 MG tablet Take 1 tablet (200 mg total) by mouth 3 (three) times daily. 11/22/14   April Palumbo, MD  terbinafine (LAMISIL AT) 1 % cream Apply to rash twice daily. 08/17/14   Veronika Heard, MD   BP 105/67 mmHg  Pulse 82  Temp(Src) 98 F (36.7 C) (Oral)  Resp 18  Ht 5\' 2"  (1.575 m)  Wt 250 lb (113.399 kg)  BMI 45.71 kg/m2  SpO2 96%   Physical Exam  General: Well-developed, well-nourished female in no acute distress; appearance consistent with age of  record HENT: normocephalic; atraumatic Eyes: pupils equal, round and reactive to light; extraocular muscles intact Neck: supple Heart: regular rate and rhythm Lungs: clear to auscultation bilaterally Abdomen: soft; nondistended; nontender; bowel sounds present Extremities: No deformity; full range of motion; pulses normal Neurologic: Awake, alert and oriented; motor function +5/5 in all extremities and symmetric; normal coordination and speech; normal gait; sensation intact in lower extremities and symmetric but was subjectively altered quality; no facial droop Skin: Warm and dry Psychiatric: Normal mood and affect    ED Course  Procedures (including critical care time)   MDM  The patient was advised that no concerning signs were found on neurologic examination. Given her existing polypharmacy with the addition of 2 minute new medications this most likely represents a medication reaction. She was advised to contact her PCP later today.     Paula Libra, MD 03/13/15 [phone]

## 2015-03-18 ENCOUNTER — Emergency Department (HOSPITAL_BASED_OUTPATIENT_CLINIC_OR_DEPARTMENT_OTHER): Payer: No Typology Code available for payment source

## 2015-03-18 ENCOUNTER — Encounter (HOSPITAL_BASED_OUTPATIENT_CLINIC_OR_DEPARTMENT_OTHER): Payer: Self-pay | Admitting: *Deleted

## 2015-03-18 ENCOUNTER — Emergency Department (HOSPITAL_BASED_OUTPATIENT_CLINIC_OR_DEPARTMENT_OTHER)
Admission: EM | Admit: 2015-03-18 | Discharge: 2015-03-18 | Disposition: A | Discharge status: Home / Self Care | Payer: No Typology Code available for payment source | Attending: Emergency Medicine | Admitting: Emergency Medicine

## 2015-03-18 DIAGNOSIS — Y9389 Activity, other specified: Secondary | ICD-10-CM | POA: Insufficient documentation

## 2015-03-18 DIAGNOSIS — K219 Gastro-esophageal reflux disease without esophagitis: Secondary | ICD-10-CM | POA: Insufficient documentation

## 2015-03-18 DIAGNOSIS — Y998 Other external cause status: Secondary | ICD-10-CM | POA: Insufficient documentation

## 2015-03-18 DIAGNOSIS — S0990XA Unspecified injury of head, initial encounter: Secondary | ICD-10-CM | POA: Diagnosis present

## 2015-03-18 DIAGNOSIS — K589 Irritable bowel syndrome without diarrhea: Secondary | ICD-10-CM | POA: Insufficient documentation

## 2015-03-18 DIAGNOSIS — G8929 Other chronic pain: Secondary | ICD-10-CM | POA: Insufficient documentation

## 2015-03-18 DIAGNOSIS — Z79899 Other long term (current) drug therapy: Secondary | ICD-10-CM | POA: Diagnosis not present

## 2015-03-18 DIAGNOSIS — E079 Disorder of thyroid, unspecified: Secondary | ICD-10-CM | POA: Diagnosis not present

## 2015-03-18 DIAGNOSIS — J45909 Unspecified asthma, uncomplicated: Secondary | ICD-10-CM | POA: Insufficient documentation

## 2015-03-18 DIAGNOSIS — M62838 Other muscle spasm: Secondary | ICD-10-CM | POA: Insufficient documentation

## 2015-03-18 DIAGNOSIS — Z880 Allergy status to penicillin: Secondary | ICD-10-CM | POA: Diagnosis not present

## 2015-03-18 DIAGNOSIS — F319 Bipolar disorder, unspecified: Secondary | ICD-10-CM | POA: Diagnosis not present

## 2015-03-18 DIAGNOSIS — Y9289 Other specified places as the place of occurrence of the external cause: Secondary | ICD-10-CM | POA: Insufficient documentation

## 2015-03-18 DIAGNOSIS — W01198A Fall on same level from slipping, tripping and stumbling with subsequent striking against other object, initial encounter: Secondary | ICD-10-CM | POA: Insufficient documentation

## 2015-03-18 DIAGNOSIS — S199XXA Unspecified injury of neck, initial encounter: Secondary | ICD-10-CM | POA: Diagnosis not present

## 2015-03-18 MED ORDER — ONDANSETRON 4 MG PO TBDP
4.0000 mg | ORAL_TABLET | Freq: Once | ORAL | Status: AC
  Administered 2015-03-18: 4 mg via ORAL
  Filled 2015-03-18: qty 1

## 2015-03-18 MED ORDER — CYCLOBENZAPRINE HCL 10 MG PO TABS: 10.0000 mg | ORAL_TABLET | Freq: Two times a day (BID) | ORAL | Status: DC | PRN

## 2015-03-18 MED ORDER — CYCLOBENZAPRINE HCL 10 MG PO TABS
10.0000 mg | ORAL_TABLET | Freq: Once | ORAL | Status: AC
  Administered 2015-03-18: 10 mg via ORAL
  Filled 2015-03-18: qty 1

## 2015-03-18 MED ORDER — ONDANSETRON 4 MG PO TBDP: 4.0000 mg | ORAL_TABLET | Freq: Three times a day (TID) | ORAL | Status: AC | PRN

## 2015-03-18 NOTE — Discharge Instructions (Signed)
Concussion  A concussion, or closed-head injury, is a brain injury caused by a direct blow to the head or by a quick and sudden movement (jolt) of the head or neck. Concussions are usually not life-threatening. Even so, the effects of a concussion can be serious. If you have had a concussion before, you are more likely to experience concussion-like symptoms after a direct blow to the head.   CAUSES  · Direct blow to the head, such as from running into another player during a soccer game, being hit in a fight, or hitting your head on a hard surface.  · A jolt of the head or neck that causes the brain to move back and forth inside the skull, such as in a car crash.  SIGNS AND SYMPTOMS  The signs of a concussion can be hard to notice. Early on, they may be missed by you, family members, and health care providers. You may look fine but act or feel differently.  Symptoms are usually temporary, but they may last for days, weeks, or even longer. Some symptoms may appear right away while others may not show up for hours or days. Every head injury is different. Symptoms include:  · Mild to moderate headaches that will not go away.  · A feeling of pressure inside your head.  · Having more trouble than usual:  ¨ Learning or remembering things you have heard.  ¨ Answering questions.  ¨ Paying attention or concentrating.  ¨ Organizing daily tasks.  ¨ Making decisions and solving problems.  · Slowness in thinking, acting or reacting, speaking, or reading.  · Getting lost or being easily confused.  · Feeling tired all the time or lacking energy (fatigued).  · Feeling drowsy.  · Sleep disturbances.  ¨ Sleeping more than usual.  ¨ Sleeping less than usual.  ¨ Trouble falling asleep.  ¨ Trouble sleeping (insomnia).  · Loss of balance or feeling lightheaded or dizzy.  · Nausea or vomiting.  · Numbness or tingling.  · Increased sensitivity to:  ¨ Sounds.  ¨ Lights.  ¨ Distractions.  · Vision problems or eyes that tire  easily.  · Diminished sense of taste or smell.  · Ringing in the ears.  · Mood changes such as feeling sad or anxious.  · Becoming easily irritated or angry for little or no reason.  · Lack of motivation.  · Seeing or hearing things other people do not see or hear (hallucinations).  DIAGNOSIS  Your health care provider can usually diagnose a concussion based on a description of your injury and symptoms. He or she will ask whether you passed out (lost consciousness) and whether you are having trouble remembering events that happened right before and during your injury.  Your evaluation might include:  · A brain scan to look for signs of injury to the brain. Even if the test shows no injury, you may still have a concussion.  · Blood tests to be sure other problems are not present.  TREATMENT  · Concussions are usually treated in an emergency department, in urgent care, or at a clinic. You may need to stay in the hospital overnight for further treatment.  · Tell your health care provider if you are taking any medicines, including prescription medicines, over-the-counter medicines, and natural remedies. Some medicines, such as blood thinners (anticoagulants) and aspirin, may increase the chance of complications. Also tell your health care provider whether you have had alcohol or are taking illegal drugs. This information   may affect treatment.  · Your health care provider will send you home with important instructions to follow.  · How fast you will recover from a concussion depends on many factors. These factors include how severe your concussion is, what part of your brain was injured, your age, and how healthy you were before the concussion.  · Most people with mild injuries recover fully. Recovery can take time. In general, recovery is slower in older persons. Also, persons who have had a concussion in the past or have other medical problems may find that it takes longer to recover from their current injury.  HOME  CARE INSTRUCTIONS  General Instructions  · Carefully follow the directions your health care provider gave you.  · Only take over-the-counter or prescription medicines for pain, discomfort, or fever as directed by your health care provider.  · Take only those medicines that your health care provider has approved.  · Do not drink alcohol until your health care provider says you are well enough to do so. Alcohol and certain other drugs may slow your recovery and can put you at risk of further injury.  · If it is harder than usual to remember things, write them down.  · If you are easily distracted, try to do one thing at a time. For example, do not try to watch TV while fixing dinner.  · Talk with family members or close friends when making important decisions.  · Keep all follow-up appointments. Repeated evaluation of your symptoms is recommended for your recovery.  · Watch your symptoms and tell others to do the same. Complications sometimes occur after a concussion. Older adults with a brain injury may have a higher risk of serious complications, such as a blood clot on the brain.  · Tell your teachers, school nurse, school counselor, coach, athletic trainer, or work manager about your injury, symptoms, and restrictions. Tell them about what you can or cannot do. They should watch for:  ¨ Increased problems with attention or concentration.  ¨ Increased difficulty remembering or learning new information.  ¨ Increased time needed to complete tasks or assignments.  ¨ Increased irritability or decreased ability to cope with stress.  ¨ Increased symptoms.  · Rest. Rest helps the brain to heal. Make sure you:  ¨ Get plenty of sleep at night. Avoid staying up late at night.  ¨ Keep the same bedtime hours on weekends and weekdays.  ¨ Rest during the day. Take daytime naps or rest breaks when you feel tired.  · Limit activities that require a lot of thought or concentration. These include:  ¨ Doing homework or job-related  work.  ¨ Watching TV.  ¨ Working on the computer.  · Avoid any situation where there is potential for another head injury (football, hockey, soccer, basketball, martial arts, downhill snow sports and horseback riding). Your condition will get worse every time you experience a concussion. You should avoid these activities until you are evaluated by the appropriate follow-up health care providers.  Returning To Your Regular Activities  You will need to return to your normal activities slowly, not all at once. You must give your body and brain enough time for recovery.  · Do not return to sports or other athletic activities until your health care provider tells you it is safe to do so.  · Ask your health care provider when you can drive, ride a bicycle, or operate heavy machinery. Your ability to react may be slower after a   brain injury. Never do these activities if you are dizzy.  · Ask your health care provider about when you can return to work or school.  Preventing Another Concussion  It is very important to avoid another brain injury, especially before you have recovered. In rare cases, another injury can lead to permanent brain damage, brain swelling, or death. The risk of this is greatest during the first 7-10 days after a head injury. Avoid injuries by:  · Wearing a seat belt when riding in a car.  · Drinking alcohol only in moderation.  · Wearing a helmet when biking, skiing, skateboarding, skating, or doing similar activities.  · Avoiding activities that could lead to a second concussion, such as contact or recreational sports, until your health care provider says it is okay.  · Taking safety measures in your home.  ¨ Remove clutter and tripping hazards from floors and stairways.  ¨ Use grab bars in bathrooms and handrails by stairs.  ¨ Place non-slip mats on floors and in bathtubs.  ¨ Improve lighting in dim areas.  SEEK MEDICAL CARE IF:  · You have increased problems paying attention or  concentrating.  · You have increased difficulty remembering or learning new information.  · You need more time to complete tasks or assignments than before.  · You have increased irritability or decreased ability to cope with stress.  · You have more symptoms than before.  Seek medical care if you have any of the following symptoms for more than 2 weeks after your injury:  · Lasting (chronic) headaches.  · Dizziness or balance problems.  · Nausea.  · Vision problems.  · Increased sensitivity to noise or light.  · Depression or mood swings.  · Anxiety or irritability.  · Memory problems.  · Difficulty concentrating or paying attention.  · Sleep problems.  · Feeling tired all the time.  SEEK IMMEDIATE MEDICAL CARE IF:  · You have severe or worsening headaches. These may be a sign of a blood clot in the brain.  · You have weakness (even if only in one hand, leg, or part of the face).  · You have numbness.  · You have decreased coordination.  · You vomit repeatedly.  · You have increased sleepiness.  · One pupil is larger than the other.  · You have convulsions.  · You have slurred speech.  · You have increased confusion. This may be a sign of a blood clot in the brain.  · You have increased restlessness, agitation, or irritability.  · You are unable to recognize people or places.  · You have neck pain.  · It is difficult to wake you up.  · You have unusual behavior changes.  · You lose consciousness.  MAKE SURE YOU:  · Understand these instructions.  · Will watch your condition.  · Will get help right away if you are not doing well or get worse.  Document Released: 02/06/2004 Document Revised: 11/21/2013 Document Reviewed: 06/08/2013  [employer]® Patient Information ©2015 [employer]. This information is not intended to replace advice given to you by your health care provider. Make sure you discuss any questions you have with your health care provider.

## 2015-03-18 NOTE — ED Notes (Signed)
Patient transported to X-ray 

## 2015-03-18 NOTE — ED Notes (Signed)
Pt amb to room 2 with quick steady gait in nad. Pt reports fall on Saturday, seen by her pcp on Saturday with no intervention. Pt states today her left lateral neck hurts and her left shoulder. Pt denies any other pain or c/o.

## 2015-03-18 NOTE — ED Notes (Signed)
MD at bedside. 

## 2015-03-18 NOTE — ED Provider Notes (Signed)
CSN: [id number]     Arrival date & time 03/18/15  4098 History   First MD Initiated Contact with Patient 03/18/15 [phone]     Chief Complaint  Patient presents with  . Neck Pain     (Consider location/radiation/quality/duration/timing/severity/associated sxs/prior Treatment) Patient is a 39 y.o. female presenting with neck pain and head injury. The history is provided by the patient.  Neck Pain Associated symptoms: no fever   Head Injury Location:  R parietal Time since incident:  2 days Mechanism of injury: fall   Pain details:    Quality:  Aching   Severity:  Mild   Duration:  2 days   Timing:  Constant   Progression:  Unchanged Chronicity:  New Relieved by:  Nothing Worsened by:  Nothing tried Ineffective treatments: Percocet. Associated symptoms: nausea and neck pain   Associated symptoms: no vomiting     Past Medical History  Diagnosis Date  . GERD (gastroesophageal reflux disease)   . Thyroid disease   . Bipolar 1 disorder   . Morbid obesity   . IBS (irritable bowel syndrome)   . Whiplash   . OSA (obstructive sleep apnea)   . Asthma    Past Surgical History  Procedure Laterality Date  . Cholecystectomy    . Tympanostomy tube placement     History reviewed. No pertinent family history. History  Substance Use Topics  . Smoking status: Never Smoker   . Smokeless tobacco: Not on file  . Alcohol Use: No   OB History    No data available     Review of Systems  Constitutional: Negative for fever.  Respiratory: Negative for cough and shortness of breath.   Gastrointestinal: Positive for nausea. Negative for vomiting and abdominal pain.  Musculoskeletal: Positive for neck pain.  All other systems reviewed and are negative.     Allergies  Amoxapine and related; Amoxicillin; Aspartame and phenylalanine; Dilaudid; Doxycycline; Flagyl; Keflex; Keppra; Milk-related compounds; Neurontin; Other; Prednisone; Pristiq; Provigil; Topamax; and Toradol  Home  Medications   Prior to Admission medications   Medication Sig Start Date End Date Taking? Authorizing Provider  clidinium-chlordiazePOXIDE (LIBRAX) 5-2.5 MG per capsule Take 1 capsule by mouth.    Historical Provider, MD  clonazePAM (KLONOPIN) 0.5 MG tablet Take 0.5 mg by mouth 3 (three) times daily.  at lunchtime only    Historical Provider, MD  Dexlansoprazole (DEXILANT PO) Take by mouth.    Historical Provider, MD  dicyclomine (BENTYL) 20 MG tablet Take 1 tablet (20 mg total) by mouth 2 (two) times daily. 09/04/14   April Palumbo, MD  diphenoxylate-atropine (LOMOTIL) 2.5-0.025 MG per tablet Take 1 tablet by mouth 4 (four) times daily as needed for diarrhea or loose stools. 02/10/15   Rolland Porter, MD  divalproex (DEPAKOTE) 500 MG DR tablet Take 500 mg by mouth at bedtime.     Historical Provider, MD  DULoxetine HCl (CYMBALTA PO) Take by mouth.    Historical Provider, MD  eszopiclone (LUNESTA) 2 MG TABS tablet Take 3 mg by mouth at bedtime as needed for sleep. Take immediately before bedtime    Historical Provider, MD  famotidine (PEPCID) 20 MG tablet Take 1 tablet (20 mg total) by mouth 2 (two) times daily. 02/02/15   April Palumbo, MD  fluconazole (DIFLUCAN) 150 MG tablet Take 150 mg by mouth daily.    Historical Provider, MD  FLUoxetine HCl (PROZAC PO) Take by mouth.    Historical Provider, MD  Fluticasone Furoate-Vilanterol (BREO ELLIPTA) 100-25 MCG/INH AEPB Inhale  into the lungs.    Historical Provider, MD  furosemide (LASIX) 20 MG tablet Take 2 tablets (40 mg total) by mouth daily. 05/03/14   Shon Baton, MD  hydrOXYzine (ATARAX/VISTARIL) 25 MG tablet Take 1 tablet (25 mg total) by mouth every 4 (four) hours as needed for itching. 05/11/14   John Molpus, MD  levalbuterol [facility] HFA) 45 MCG/ACT inhaler Inhale into the lungs every 4 (four) hours as needed for wheezing.    Historical Provider, MD  levothyroxine (SYNTHROID, LEVOTHROID) 75 MCG tablet Take 88 mcg by mouth daily before  breakfast.     Historical Provider, MD  loratadine (CLARITIN) 10 MG tablet Take 10 mg by mouth daily.    Historical Provider, MD  methylPREDNIsolone (MEDROL DOSPACK) 4 MG tablet follow package directions 02/02/15   April Palumbo, MD  Mirabegron (MYRBETRIQ PO) Take by mouth.    Historical Provider, MD  mirabegron ER (MYRBETRIQ) 50 MG TB24 tablet Take 50 mg by mouth daily.    Historical Provider, MD  montelukast (SINGULAIR) 10 MG tablet Take 10 mg by mouth at bedtime.    Historical Provider, MD  nitrofurantoin, macrocrystal-monohydrate, (MACROBID) 100 MG capsule Take 1 capsule (100 mg total) by mouth 2 (two) times daily. X 7 days 09/04/14   April Palumbo, MD  nitrofurantoin, macrocrystal-monohydrate, (MACROBID) 100 MG capsule Take 1 capsule (100 mg total) by mouth 2 (two) times daily. X 7 days 11/22/14   April Palumbo, MD  omeprazole (PRILOSEC) 20 MG capsule Take 1 capsule (20 mg total) by mouth daily. 07/22/14   April Palumbo, MD  ondansetron (ZOFRAN ODT) 4 MG disintegrating tablet Take 1 tablet (4 mg total) by mouth every 8 (eight) hours as needed for nausea. 02/10/15   Rolland Porter, MD  orphenadrine (NORFLEX) 100 MG tablet Take 1 tablet (100 mg total) by mouth 2 (two) times daily. 05/18/14   Blane Ohara, MD  oxyCODONE-acetaminophen (PERCOCET) 5-325 MG per tablet Take 1-2 tablets by mouth every 6 (six) hours as needed (for pain). 05/11/14   Paula Libra, MD  OXYGEN Inhale into the lungs. **With CPAP at night**    Historical Provider, MD  phenazopyridine (PYRIDIUM) 200 MG tablet Take 1 tablet (200 mg total) by mouth 3 (three) times daily. 09/04/14   April Palumbo, MD  phenazopyridine (PYRIDIUM) 200 MG tablet Take 1 tablet (200 mg total) by mouth 3 (three) times daily. 11/22/14   April Palumbo, MD  pindolol (VISKEN) 5 MG tablet Take 5 mg by mouth 2 (two) times daily.    Historical Provider, MD  Potassium Chloride CR (MICRO-K) 8 MEQ CPCR capsule CR Take 8 mEq by mouth.    Historical Provider, MD  primidone  (MYSOLINE) 50 MG tablet Take by mouth 4 (four) times daily.    Historical Provider, MD  promethazine (PHENERGAN) 25 MG tablet Take 1 tablet (25 mg total) by mouth every 6 (six) hours as needed for nausea or vomiting. 02/10/15   Rolland Porter, MD  ranitidine (ZANTAC) 300 MG tablet Take 300 mg by mouth at bedtime.    Historical Provider, MD  risperidone (RISPERDAL) 4 MG tablet Take 2 mg by mouth daily.     Historical Provider, MD  terbinafine (LAMISIL AT) 1 % cream Apply to rash twice daily. 08/17/14   John Molpus, MD  Zolpidem Tartrate (AMBIEN PO) Take by mouth.    Historical Provider, MD   BP 119/72 mmHg  Pulse 90  Temp(Src) 98.2 F (36.8 C) (Oral)  Ht  (1.575 m)  Wt 234  lb (106.142 kg)  BMI 42.79 kg/m2  SpO2 97% Physical Exam  Constitutional: She is oriented to person, place, and time. She appears well-developed and well-nourished. No distress.  HENT:  Head: Normocephalic and atraumatic.  Mouth/Throat: Oropharynx is clear and moist.  Eyes: EOM are normal. Pupils are equal, round, and reactive to light.  Neck: Normal range of motion. Neck supple.  Cardiovascular: Normal rate and regular rhythm.  Exam reveals no friction rub.   No murmur heard. Pulmonary/Chest: Effort normal and breath sounds normal. No respiratory distress. She has no wheezes. She has no rales.  Abdominal: Soft. She exhibits no distension. There is no tenderness. There is no rebound.  Musculoskeletal: Normal range of motion. She exhibits no edema.       Cervical back: She exhibits tenderness (in R and L sided musculature) and pain. She exhibits no bony tenderness, no swelling, no edema and no spasm.  Neurological: She is alert and oriented to person, place, and time. No cranial nerve deficit. She exhibits normal muscle tone. Coordination normal.  Skin: No rash noted. She is not diaphoretic.  Nursing note and vitals reviewed.   ED Course  Procedures (including critical care time) Labs Review Labs Reviewed - No data  to display  Imaging Review Ct Head Wo Contrast  03/18/2015   CLINICAL DATA:  Headache and dizziness since a fall 2 days ago at which time she struck the back of her head.  EXAM: CT HEAD WITHOUT CONTRAST  TECHNIQUE: Contiguous axial images were obtained from the base of the skull through the vertex without intravenous contrast.  COMPARISON:  None.  FINDINGS: No mass lesion. No midline shift. No acute hemorrhage or hematoma. No extra-axial fluid collections. No evidence of acute infarction. There is slight diffuse atrophy. Brain parenchyma is otherwise normal. No osseous abnormality.  IMPRESSION: No acute intracranial abnormality.  Slight diffuse atrophy.   Electronically Signed   By: Francene BoyersJames  Maxwell M.D.   On: 03/18/2015 09:25     EKG Interpretation None      MDM   Final diagnoses:  Head injury  Muscle spasm    -year-old female here after a fall. She fell hitting the right side of her head. She was choking and blacked out from choking and fell and hit her head. She did not continue choking. She's having right parietal headache since then. She's also had some neck spasms. History of whiplash with chronic neck and shoulder pain. She has no bony tenderness or neck. I cannot find any hematoma or evidence of trauma. She reports nausea this morning and diaphoresis. This was not relieved with Percocet. We'll CT her head to rule out intracranial injury. Flexeril given for her neck spasms.  CT negative. Given Rx for flexeril and zofran.  Elwin MochaBlair Zygmund Passero, MD 03/18/15 [phone]

## 2015-04-07 ENCOUNTER — Emergency Department (HOSPITAL_BASED_OUTPATIENT_CLINIC_OR_DEPARTMENT_OTHER): Payer: No Typology Code available for payment source

## 2015-04-07 ENCOUNTER — Emergency Department (HOSPITAL_BASED_OUTPATIENT_CLINIC_OR_DEPARTMENT_OTHER)
Admission: EM | Admit: 2015-04-07 | Discharge: 2015-04-07 | Disposition: A | Discharge status: Home / Self Care | Payer: No Typology Code available for payment source | Attending: Emergency Medicine | Admitting: Emergency Medicine

## 2015-04-07 ENCOUNTER — Encounter (HOSPITAL_BASED_OUTPATIENT_CLINIC_OR_DEPARTMENT_OTHER): Payer: Self-pay | Admitting: *Deleted

## 2015-04-07 DIAGNOSIS — S8992XA Unspecified injury of left lower leg, initial encounter: Secondary | ICD-10-CM | POA: Insufficient documentation

## 2015-04-07 DIAGNOSIS — F319 Bipolar disorder, unspecified: Secondary | ICD-10-CM | POA: Diagnosis not present

## 2015-04-07 DIAGNOSIS — Z880 Allergy status to penicillin: Secondary | ICD-10-CM | POA: Insufficient documentation

## 2015-04-07 DIAGNOSIS — G4733 Obstructive sleep apnea (adult) (pediatric): Secondary | ICD-10-CM | POA: Diagnosis not present

## 2015-04-07 DIAGNOSIS — R52 Pain, unspecified: Secondary | ICD-10-CM

## 2015-04-07 DIAGNOSIS — Z9981 Dependence on supplemental oxygen: Secondary | ICD-10-CM | POA: Insufficient documentation

## 2015-04-07 DIAGNOSIS — Z79899 Other long term (current) drug therapy: Secondary | ICD-10-CM | POA: Diagnosis not present

## 2015-04-07 DIAGNOSIS — Y939 Activity, unspecified: Secondary | ICD-10-CM | POA: Insufficient documentation

## 2015-04-07 DIAGNOSIS — J45909 Unspecified asthma, uncomplicated: Secondary | ICD-10-CM | POA: Diagnosis not present

## 2015-04-07 DIAGNOSIS — W1830XA Fall on same level, unspecified, initial encounter: Secondary | ICD-10-CM | POA: Insufficient documentation

## 2015-04-07 DIAGNOSIS — Y999 Unspecified external cause status: Secondary | ICD-10-CM | POA: Insufficient documentation

## 2015-04-07 DIAGNOSIS — K589 Irritable bowel syndrome without diarrhea: Secondary | ICD-10-CM | POA: Diagnosis not present

## 2015-04-07 DIAGNOSIS — E079 Disorder of thyroid, unspecified: Secondary | ICD-10-CM | POA: Diagnosis not present

## 2015-04-07 DIAGNOSIS — Z87828 Personal history of other (healed) physical injury and trauma: Secondary | ICD-10-CM | POA: Insufficient documentation

## 2015-04-07 DIAGNOSIS — M79605 Pain in left leg: Secondary | ICD-10-CM

## 2015-04-07 DIAGNOSIS — K219 Gastro-esophageal reflux disease without esophagitis: Secondary | ICD-10-CM | POA: Insufficient documentation

## 2015-04-07 DIAGNOSIS — Y929 Unspecified place or not applicable: Secondary | ICD-10-CM | POA: Diagnosis not present

## 2015-04-07 LAB — D-DIMER, QUANTITATIVE (NOT AT ARMC): D-Dimer, Quant: 0.29 ug/mL-FEU (ref 0.00–0.48)

## 2015-04-07 MED ORDER — LIDOCAINE 5 % EX PTCH: 1.0000 | MEDICATED_PATCH | CUTANEOUS | Status: DC

## 2015-04-07 MED ORDER — LIDOCAINE HCL 2 % EX GEL
1.0000 "application " | Freq: Once | CUTANEOUS | Status: AC
  Administered 2015-04-07: 1 via TOPICAL

## 2015-04-07 MED ORDER — METHOCARBAMOL 500 MG PO TABS: 500.0000 mg | ORAL_TABLET | Freq: Two times a day (BID) | ORAL | Status: DC

## 2015-04-07 MED ORDER — METHOCARBAMOL 500 MG PO TABS
1000.0000 mg | ORAL_TABLET | Freq: Once | ORAL | Status: AC
  Administered 2015-04-07: 1000 mg via ORAL
  Filled 2015-04-07: qty 2

## 2015-04-07 MED ORDER — LIDOCAINE HCL 2 % EX GEL
CUTANEOUS | Status: AC
  Administered 2015-04-07: 1 via TOPICAL
  Filled 2015-04-07: qty 20

## 2015-04-07 NOTE — ED Notes (Signed)
Dr. Palumbo in to see pt, at BS.  

## 2015-04-07 NOTE — ED Notes (Addendum)
C/o L leg pain (hip to toes), mentions [facility] 4/16 [facility] and subsequent d/c on 5/2, pain is worse (increasing severity and location), was taking percocet and advil, none in last 6 hrs. CMS intact. No obvious swelling. Pt alert, NAD, calm, interactive, limping, back to room in w/c. Also mentions recent cellulitis of neck area and finished cephalexin.

## 2015-04-07 NOTE — ED Notes (Signed)
Dr. Palumbo at BS 

## 2015-04-07 NOTE — ED Notes (Signed)
Pt to xray, no changes, pt intermitantly tearful. NAD, no dyspnea noted.

## 2015-04-07 NOTE — ED Provider Notes (Signed)
CSN: [id number]     Arrival date & time 04/07/15  0340 History   First MD Initiated Contact with Patient 04/07/15 0410     Chief Complaint  Patient presents with  . Leg Pain     (Consider location/radiation/quality/duration/timing/severity/associated sxs/prior Treatment) Patient is a 39 y.o. female presenting with leg pain. The history is provided by the patient.  Leg Pain Location:  Leg Injury: yes   Mechanism of injury: fall   Fall:    Fall occurred:  Standing (03/16/15)   Impact surface:  Hard floor   Entrapped after fall: no   Leg location:  L lower leg Pain details:    Quality:  Aching   Radiates to:  Does not radiate   Severity:  Moderate   Onset quality:  Gradual   Timing:  Constant   Progression:  Unchanged Chronicity:  New Foreign body present:  No foreign bodies Relieved by:  Nothing Worsened by:  Nothing tried Ineffective treatments:  None tried Associated symptoms: no back pain, no stiffness, no swelling and no tingling   Risk factors: no concern for non-accidental trauma   Se[facility] by physical therapy [facility] an inpatient [facility].  No imaging done despite fact injury was 4/16.  Has been ambulatory  Past Medical History  Diagnosis Date  . GERD (gastroesophageal reflux disease)   . Thyroid disease   . Bipolar 1 disorder   . Morbid obesity   . IBS (irritable bowel syndrome)   . Whiplash   . OSA (obstructive sleep apnea)   . Asthma    Past Surgical History  Procedure Laterality Date  . Cholecystectomy    . Tympanostomy tube placement     History reviewed. No pertinent family history. History  Substance Use Topics  . Smoking status: Never Smoker   . Smokeless tobacco: Not on file  . Alcohol Use: No   OB History    No data available     Review of Systems  Respiratory: Negative for chest tightness and shortness of breath.   Cardiovascular: Negative for chest pain, palpitations and leg swelling.  Musculoskeletal: Negative for back pain and stiffness.   All other systems reviewed and are negative.     Allergies  Amoxapine and related; Amoxicillin; Aspartame and phenylalanine; Dilaudid; Doxycycline; Flagyl; Keflex; Keppra; Milk-related compounds; Neurontin; Other; Prednisone; Pristiq; Provigil; Topamax; and Toradol  Home Medications   Prior to Admission medications   Medication Sig Start Date End Date Taking? Authorizing Provider  clidinium-chlordiazePOXIDE (LIBRAX) 5-2.5 MG per capsule Take 1 capsule by mouth.    Historical Provider, MD  clonazePAM (KLONOPIN) 0.5 MG tablet Take 0.5 mg by mouth 3 (three) times daily. 1mg  at lunchtime only    Historical Provider, MD  cyclobenzaprine (FLEXERIL) 10 MG tablet Take 1 tablet (10 mg total) by mouth 2 (two) times daily as needed for muscle spasms. 03/18/15   Elwin MochaBlair Walden, MD  Dexlansoprazole (DEXILANT PO) Take by mouth.    Historical Provider, MD  dicyclomine (BENTYL) 20 MG tablet Take 1 tablet (20 mg total) by mouth 2 (two) times daily. 09/04/14   Taron Conrey, MD  diphenoxylate-atropine (LOMOTIL) 2.5-0.025 MG per tablet Take 1 tablet by mouth 4 (four) times daily as needed for diarrhea or loose stools. 02/10/15   Rolland PorterMark James, MD  divalproex (DEPAKOTE) 500 MG DR tablet Take 500 mg by mouth at bedtime.     Historical Provider, MD  DULoxetine HCl (CYMBALTA PO) Take by mouth.    Historical Provider, MD  eszopiclone Alfonso Patten(LUNESTA) 2  MG TABS tablet Take 3 mg by mouth at bedtime as needed for sleep. Take immediately before bedtime    Historical Provider, MD  famotidine (PEPCID) 20 MG tablet Take 1 tablet (20 mg total) by mouth 2 (two) times daily. 02/02/15   Davonda Ausley, MD  fluconazole (DIFLUCAN) 150 MG tablet Take 150 mg by mouth daily.    Historical Provider, MD  FLUoxetine HCl (PROZAC PO) Take by mouth.    Historical Provider, MD  Fluticasone Furoate-Vilanterol (BREO ELLIPTA) 100-25 MCG/INH AEPB Inhale into the lungs.    Historical Provider, MD  furosemide (LASIX) 20 MG tablet Take 2 tablets (40 mg total)  by mouth daily. 05/03/14   Shon Batonourtney F Horton, MD  hydrOXYzine (ATARAX/VISTARIL) 25 MG tablet Take 1 tablet (25 mg total) by mouth every 4 (four) hours as needed for itching. 05/11/14   John Molpus, MD  levalbuterol [facility](XOPENEX HFA) 45 MCG/ACT inhaler Inhale into the lungs every 4 (four) hours as needed for wheezing.    Historical Provider, MD  levothyroxine (SYNTHROID, LEVOTHROID) 75 MCG tablet Take 88 mcg by mouth daily before breakfast.     Historical Provider, MD  loratadine (CLARITIN) 10 MG tablet Take 10 mg by mouth daily.    Historical Provider, MD  methylPREDNIsolone (MEDROL DOSPACK) 4 MG tablet follow package directions 02/02/15   Tyrus Wilms, MD  Mirabegron (MYRBETRIQ PO) Take by mouth.    Historical Provider, MD  mirabegron ER (MYRBETRIQ) 50 MG TB24 tablet Take 50 mg by mouth daily.    Historical Provider, MD  montelukast (SINGULAIR) 10 MG tablet Take 10 mg by mouth at bedtime.    Historical Provider, MD  nitrofurantoin, macrocrystal-monohydrate, (MACROBID) 100 MG capsule Take 1 capsule (100 mg total) by mouth 2 (two) times daily. X 7 days 09/04/14   Jani Moronta, MD  nitrofurantoin, macrocrystal-monohydrate, (MACROBID) 100 MG capsule Take 1 capsule (100 mg total) by mouth 2 (two) times daily. X 7 days 11/22/14   Kayin Kettering, MD  omeprazole (PRILOSEC) 20 MG capsule Take 1 capsule (20 mg total) by mouth daily. 07/22/14   Destynie Toomey, MD  ondansetron (ZOFRAN ODT) 4 MG disintegrating tablet Take 1 tablet (4 mg total) by mouth every 8 (eight) hours as needed for nausea. 02/10/15   Rolland PorterMark James, MD  ondansetron (ZOFRAN-ODT) 4 MG disintegrating tablet Take 1 tablet (4 mg total) by mouth every 8 (eight) hours as needed for nausea or vomiting. 03/18/15   Elwin MochaBlair Walden, MD  orphenadrine (NORFLEX) 100 MG tablet Take 1 tablet (100 mg total) by mouth 2 (two) times daily. 05/18/14   Blane OharaJoshua Zavitz, MD  oxyCODONE-acetaminophen (PERCOCET) 5-325 MG per tablet Take 1-2 tablets by mouth every 6 (six) hours as needed  (for pain). 05/11/14   Paula LibraJohn Molpus, MD  OXYGEN Inhale into the lungs. **With CPAP at night**    Historical Provider, MD  phenazopyridine (PYRIDIUM) 200 MG tablet Take 1 tablet (200 mg total) by mouth 3 (three) times daily. 09/04/14   Kylar Speelman, MD  phenazopyridine (PYRIDIUM) 200 MG tablet Take 1 tablet (200 mg total) by mouth 3 (three) times daily. 11/22/14   Amberley Hamler, MD  pindolol (VISKEN) 5 MG tablet Take 5 mg by mouth 2 (two) times daily.    Historical Provider, MD  Potassium Chloride CR (MICRO-K) 8 MEQ CPCR capsule CR Take 8 mEq by mouth.    Historical Provider, MD  primidone (MYSOLINE) 50 MG tablet Take by mouth 4 (four) times daily.    Historical Provider, MD  promethazine (PHENERGAN) 25  MG tablet Take 1 tablet (25 mg total) by mouth every 6 (six) hours as needed for nausea or vomiting. 02/10/15   Rolland Porter, MD  ranitidine (ZANTAC) 300 MG tablet Take 300 mg by mouth at bedtime.    Historical Provider, MD  risperidone (RISPERDAL) 4 MG tablet Take 2 mg by mouth daily.     Historical Provider, MD  terbinafine (LAMISIL AT) 1 % cream Apply to rash twice daily. 08/17/14   John Molpus, MD  Zolpidem Tartrate (AMBIEN PO) Take by mouth.    Historical Provider, MD   BP 111/68 mmHg  Pulse 91  Temp(Src) 98.6 F (37 C) (Oral)  Resp 18  Ht  (1.575 m)  Wt 267 lb (121.11 kg)  BMI 48.82 kg/m2  SpO2 97% Physical Exam  Constitutional: She is oriented to person, place, and time. She appears well-developed and well-nourished. No distress.  HENT:  Head: Normocephalic and atraumatic.  Mouth/Throat: Oropharynx is clear and moist.  Eyes: Conjunctivae are normal. Pupils are equal, round, and reactive to light.  Neck: Normal range of motion. Neck supple.  Cardiovascular: Normal rate, regular rhythm and intact distal pulses.   Pulmonary/Chest: Effort normal and breath sounds normal. No respiratory distress. She has no wheezes. She has no rales.  Abdominal: Soft. Bowel sounds are normal. There is  no tenderness. There is no rebound and no guarding.  Musculoskeletal: Normal range of motion. She exhibits no edema.       Left hip: Normal.       Left knee: She exhibits normal range of motion, no swelling, no effusion, no ecchymosis, no deformity, no laceration, no erythema, normal alignment, no LCL laxity, normal patellar mobility, no bony tenderness, normal meniscus and no MCL laxity. No tenderness found. No medial joint line, no lateral joint line, no MCL, no LCL and no patellar tendon tenderness noted.       Left ankle: Normal. She exhibits normal range of motion. Achilles tendon normal.       Left lower leg: She exhibits no tenderness, no bony tenderness, no swelling, no edema, no deformity and no laceration.       Left foot: Normal.  Intact quadriceps tendon, negative anterior and posterior drawer tests of L knee.  No foreshortening nor rotations able to straight leg raise.  No cords all compartments are soft  Neurological: She is alert and oriented to person, place, and time. She has normal reflexes.  Skin: Skin is warm and dry.  Psychiatric: Thought content normal.    ED Course  Procedures (including critical care time) Labs Review Labs Reviewed  D-DIMER, QUANTITATIVE    Imaging Review No results found.   EKG Interpretation None      MDM   Final diagnoses:  Pain    No DVT, negative Xrays will treat symptomatically with lidoderm patches and robaxin and close follow up    Calob Baskette, MD 04/07/15 9147

## 2015-04-11 ENCOUNTER — Encounter (HOSPITAL_BASED_OUTPATIENT_CLINIC_OR_DEPARTMENT_OTHER): Payer: Self-pay

## 2015-04-11 ENCOUNTER — Emergency Department (HOSPITAL_BASED_OUTPATIENT_CLINIC_OR_DEPARTMENT_OTHER)
Admission: EM | Admit: 2015-04-11 | Discharge: 2015-04-11 | Disposition: A | Discharge status: Home / Self Care | Payer: No Typology Code available for payment source | Attending: Emergency Medicine | Admitting: Emergency Medicine

## 2015-04-11 DIAGNOSIS — Z880 Allergy status to penicillin: Secondary | ICD-10-CM | POA: Diagnosis not present

## 2015-04-11 DIAGNOSIS — M79662 Pain in left lower leg: Secondary | ICD-10-CM | POA: Diagnosis not present

## 2015-04-11 DIAGNOSIS — Z87828 Personal history of other (healed) physical injury and trauma: Secondary | ICD-10-CM | POA: Insufficient documentation

## 2015-04-11 DIAGNOSIS — K589 Irritable bowel syndrome without diarrhea: Secondary | ICD-10-CM | POA: Insufficient documentation

## 2015-04-11 DIAGNOSIS — E079 Disorder of thyroid, unspecified: Secondary | ICD-10-CM | POA: Diagnosis not present

## 2015-04-11 DIAGNOSIS — G4733 Obstructive sleep apnea (adult) (pediatric): Secondary | ICD-10-CM | POA: Insufficient documentation

## 2015-04-11 DIAGNOSIS — K219 Gastro-esophageal reflux disease without esophagitis: Secondary | ICD-10-CM | POA: Diagnosis not present

## 2015-04-11 DIAGNOSIS — F319 Bipolar disorder, unspecified: Secondary | ICD-10-CM | POA: Insufficient documentation

## 2015-04-11 DIAGNOSIS — Z9981 Dependence on supplemental oxygen: Secondary | ICD-10-CM | POA: Diagnosis not present

## 2015-04-11 DIAGNOSIS — J45909 Unspecified asthma, uncomplicated: Secondary | ICD-10-CM | POA: Diagnosis not present

## 2015-04-11 DIAGNOSIS — Z79899 Other long term (current) drug therapy: Secondary | ICD-10-CM | POA: Diagnosis not present

## 2015-04-11 DIAGNOSIS — M79605 Pain in left leg: Secondary | ICD-10-CM

## 2015-04-11 NOTE — ED Provider Notes (Signed)
TIME SEEN: 10:47 PM  CHIEF COMPLAINT: arthritis  HPI:  Jessica Nicholson is a 39 y.o. female who presents to the Emergency Department complaining of left leg, hip and knee pain after a fall on April 16. She states that the pain is continuously bothering her. She states that she fell April 16th and was seen on Sunday and resonant to the emergency department on May 8 and had negative x-rays of her left knee and left tib/fib and had a negative d-dimer. She states that she does not know if the pain is continuous from the injury or from her weight. She states that her PCP suggested that she come to the ED to see if there was a blood clot. She states that she was told she might have neuropathy. She states that she was given Robaxin and uses a "pain patch' on her knee in the ED to some relief. She states that she [facility]. She denies previous history of DVT, exogenous estrogen use, recent fractures or surgeries or flight.   ROS: See HPI Constitutional: no fever  Eyes: no drainage  ENT: no runny nose   Cardiovascular:  no chest pain  Resp: no SOB  GI: no vomiting GU: no dysuria Integumentary: no rash  Allergy: no hives  Musculoskeletal: no leg swelling  Neurological: no slurred speech ROS otherwise negative  PAST MEDICAL HISTORY/PAST SURGICAL HISTORY:  Past Medical History  Diagnosis Date  . GERD (gastroesophageal reflux disease)   . Thyroid disease   . Bipolar 1 disorder   . Morbid obesity   . IBS (irritable bowel syndrome)   . Whiplash   . OSA (obstructive sleep apnea)   . Asthma     MEDICATIONS:  Prior to Admission medications   Medication Sig Start Date End Date Taking? Authorizing Provider  clidinium-chlordiazePOXIDE (LIBRAX) 5-2.5 MG per capsule Take 1 capsule by mouth.    Historical Provider, MD  clonazePAM (KLONOPIN) 0.5 MG tablet Take 0.5 mg by mouth 3 (three) times daily. 1mg  at lunchtime only    Historical Provider, MD   cyclobenzaprine (FLEXERIL) 10 MG tablet Take 1 tablet (10 mg total) by mouth 2 (two) times daily as needed for muscle spasms. 03/18/15   Elwin MochaBlair Walden, MD  Dexlansoprazole (DEXILANT PO) Take by mouth.    Historical Provider, MD  dicyclomine (BENTYL) 20 MG tablet Take 1 tablet (20 mg total) by mouth 2 (two) times daily. 09/04/14   April Palumbo, MD  diphenoxylate-atropine (LOMOTIL) 2.5-0.025 MG per tablet Take 1 tablet by mouth 4 (four) times daily as needed for diarrhea or loose stools. 02/10/15   Rolland PorterMark James, MD  divalproex (DEPAKOTE) 500 MG DR tablet Take 500 mg by mouth at bedtime.     Historical Provider, MD  DULoxetine HCl (CYMBALTA PO) Take by mouth.    Historical Provider, MD  eszopiclone (LUNESTA) 2 MG TABS tablet Take 3 mg by mouth at bedtime as needed for sleep. Take immediately before bedtime    Historical Provider, MD  famotidine (PEPCID) 20 MG tablet Take 1 tablet (20 mg total) by mouth 2 (two) times daily. 02/02/15   April Palumbo, MD  fluconazole (DIFLUCAN) 150 MG tablet Take 150 mg by mouth daily.    Historical Provider, MD  FLUoxetine HCl (PROZAC PO) Take by mouth.    Historical Provider, MD  Fluticasone Furoate-Vilanterol (BREO ELLIPTA) 100-25 MCG/INH AEPB Inhale into the lungs.    Historical Provider, MD  furosemide (LASIX) 20 MG tablet Take  2 tablets (40 mg total) by mouth daily. 05/03/14   Shon Baton, MD  hydrOXYzine (ATARAX/VISTARIL) 25 MG tablet Take 1 tablet (25 mg total) by mouth every 4 (four) hours as needed for itching. 05/11/14   John Molpus, MD  levalbuterol [facility] HFA) 45 MCG/ACT inhaler Inhale into the lungs every 4 (four) hours as needed for wheezing.    Historical Provider, MD  levothyroxine (SYNTHROID, LEVOTHROID) 75 MCG tablet Take 88 mcg by mouth daily before breakfast.     Historical Provider, MD  lidocaine (LIDODERM) 5 % Place 1 patch onto the skin daily. Remove & Discard patch within 12 hours or as directed by MD 04/07/15   April Palumbo, MD  loratadine  (CLARITIN) 10 MG tablet Take 10 mg by mouth daily.    Historical Provider, MD  MAGNESIUM PO Take by mouth.    Historical Provider, MD  Melatonin 5 MG TABS Take 5 mg by mouth at bedtime.    Historical Provider, MD  methocarbamol (ROBAXIN) 500 MG tablet Take 1 tablet (500 mg total) by mouth 2 (two) times daily. 04/07/15   April Palumbo, MD  methylPREDNIsolone (MEDROL [facility]) 4 MG tablet follow package directions 02/02/15   April Palumbo, MD  Mirabegron (MYRBETRIQ PO) Take by mouth.    Historical Provider, MD  mirabegron ER (MYRBETRIQ) 50 MG TB24 tablet Take 50 mg by mouth daily.    Historical Provider, MD  montelukast (SINGULAIR) 10 MG tablet Take 10 mg by mouth at bedtime.    Historical Provider, MD  nitrofurantoin, macrocrystal-monohydrate, (MACROBID) 100 MG capsule Take 1 capsule (100 mg total) by mouth 2 (two) times daily. X 7 days 09/04/14   April Palumbo, MD  nitrofurantoin, macrocrystal-monohydrate, (MACROBID) 100 MG capsule Take 1 capsule (100 mg total) by mouth 2 (two) times daily. X 7 days 11/22/14   April Palumbo, MD  omeprazole (PRILOSEC) 20 MG capsule Take 1 capsule (20 mg total) by mouth daily. 07/22/14   April Palumbo, MD  ondansetron (ZOFRAN ODT) 4 MG disintegrating tablet Take 1 tablet (4 mg total) by mouth every 8 (eight) hours as needed for nausea. 02/10/15   Rolland Porter, MD  ondansetron (ZOFRAN-ODT) 4 MG disintegrating tablet Take 1 tablet (4 mg total) by mouth every 8 (eight) hours as needed for nausea or vomiting. 03/18/15   Elwin Mocha, MD  orphenadrine (NORFLEX) 100 MG tablet Take 1 tablet (100 mg total) by mouth 2 (two) times daily. 05/18/14   Blane Ohara, MD  oxyCODONE-acetaminophen (PERCOCET) 5-325 MG per tablet Take 1-2 tablets by mouth every 6 (six) hours as needed (for pain). 05/11/14   Paula Libra, MD  OXYGEN Inhale into the lungs. **With CPAP at night**    Historical Provider, MD  phenazopyridine (PYRIDIUM) 200 MG tablet Take 1 tablet (200 mg total) by mouth 3 (three) times  daily. 09/04/14   April Palumbo, MD  phenazopyridine (PYRIDIUM) 200 MG tablet Take 1 tablet (200 mg total) by mouth 3 (three) times daily. 11/22/14   April Palumbo, MD  pindolol (VISKEN) 5 MG tablet Take 5 mg by mouth 2 (two) times daily.    Historical Provider, MD  Potassium Chloride CR (MICRO-K) 8 MEQ CPCR capsule CR Take 8 mEq by mouth.    Historical Provider, MD  primidone (MYSOLINE) 50 MG tablet Take by mouth 4 (four) times daily.    Historical Provider, MD  promethazine (PHENERGAN) 25 MG tablet Take 1 tablet (25 mg total) by mouth every 6 (six) hours as needed for nausea or vomiting. 02/10/15  Rolland PorterMark James, MD  ranitidine (ZANTAC) 300 MG tablet Take 300 mg by mouth at bedtime.    Historical Provider, MD  risperidone (RISPERDAL) 4 MG tablet Take 2 mg by mouth daily.     Historical Provider, MD  terbinafine (LAMISIL AT) 1 % cream Apply to rash twice daily. 08/17/14   John Molpus, MD  traZODone (DESYREL) 50 MG tablet Take 50 mg by mouth at bedtime.    Historical Provider, MD  Zolpidem Tartrate (AMBIEN PO) Take by mouth.    Historical Provider, MD    ALLERGIES:  Allergies  Allergen Reactions  . Amoxapine And Related   . Amoxicillin   . Aspartame And Phenylalanine     Migraine   . Dilaudid [Hydromorphone Hcl] Itching  . Doxycycline   . Flagyl [Metronidazole]   . Keflex [Cephalexin]   . Keppra [Levetiracetam]   . Milk-Related Compounds   . Neurontin [Gabapentin]     "It makes me feel anxious or depressed"  . Other     "bersomra" from written list  . Prednisone   . Pristiq [Desvenlafaxine]   . Provigil [Modafinil]   . Topamax [Topiramate]   . Toradol [Ketorolac Tromethamine]     SOCIAL HISTORY:  History  Substance Use Topics  . Smoking status: Never Smoker   . Smokeless tobacco: Not on file  . Alcohol Use: No    FAMILY HISTORY: No family history on file.  EXAM: BP 101/59 mmHg  Pulse 79  Temp(Src) 98.4 F (36.9 C) (Oral)  Resp 18  Ht 5\' 2"  (1.575 m)  Wt 266 lb  (120.657 kg)  BMI 48.64 kg/m2  SpO2 95% CONSTITUTIONAL: Alert and oriented and responds appropriately to questions. Well-appearing; well-nourished, obese, in no distress HEAD: Normocephalic EYES: Conjunctivae clear, PERRL ENT: normal nose; no rhinorrhea; moist mucous membranes; pharynx without lesions noted NECK: Supple, no meningismus, no LAD  CARD: RRR; S1 and S2 appreciated; no murmurs, no clicks, no rubs, no gallops RESP: Normal chest excursion without splinting or tachypnea; breath sounds clear and equal bilaterally; no wheezes, no rhonchi, no rales, no hypoxia or respiratory distress, speaking full sentences ABD/GI: Normal bowel sounds; non-distended; soft, non-tender, no rebound, no guarding, no peritoneal signs BACK:  The back appears normal and is non-tender to palpation, there is no CVA tenderness EXT: [employer] palpation throughout the entire left lower extremity without any bony deformity, compartments are soft, no joint effusion, 2+ DP pulses bilaterally, sensation to light touch intact diffusely, no erythema or warmth or induration or fluctuance, Normal ROM in all joints; otherwise extremities are non-tender to palpation; no edema; normal capillary refill; no cyanosis, no calf tenderness or swelling    SKIN: Normal color for age and race; warm NEURO: Moves all extremities equally, sensation to light touch intact diffusely, cranial nerves II through XII intact,  normal gait PSYCH: The patient's mood and manner are appropriate. Grooming and personal hygiene are appropriate.  MEDICAL DECISION MAKING: Patient here with pain for the past month to her left leg. Has had x-rays that have been unremarkable and a negative d-dimer. States she was told to come here by her primary care physician Dr. Jacqlyn LarsenBulla to rule out DVT. Discussed with patient that several days ago she had a negative d-dimer which ruled out DVT. I do not feel she needs any repeat imaging today she is not having any new injury. No  sign of infection on exam. No bony deformity. Neurovascularly intact distally. Discussed with patient that given she is able to ambulate without  any difficulty I doubt that that she has a fracture. I suspect there may be some component of drug-seeking behavior. Patient frequently visits the emergency department for painful conditions. Have advised her to continue her Lidoderm patches and muscle relaxers as prescribed. Have advised her to follow up with her primary care's edition for further management for her pain. I do not feel is indicated to discharge the patient with narcotics at this time. Discussed return precautions. She verbalized understanding and is comfortable plan.      Layla Maw Ward, DO 04/11/15 2337

## 2015-04-11 NOTE — ED Notes (Signed)
Pt states she was seen here Sunday for pain to entire left leg-dx with arthritis-cont'd pain

## 2015-04-11 NOTE — ED Notes (Signed)
Pt drove self-was in w/c in ED lobby-stood with steps to scale for weight in triage

## 2015-04-11 NOTE — ED Notes (Signed)
C/o left, leg, hip and knee pain,  Was seen here on Sunday for same

## 2015-04-11 NOTE — Discharge Instructions (Signed)
You had a negative d-dimer on May 8 and negative x-rays of your left leg. This rules out that you have a blood clot. Please follow-up with your primary care provider for further management of your pain. Please take your Lidoderm patches and muscle relaxers as prescribed.   Contusion A contusion is a deep bruise. Contusions are the result of an injury that caused bleeding under the skin. The contusion may turn blue, purple, or yellow. Minor injuries will give you a painless contusion, but more severe contusions may stay painful and swollen for a few weeks.  CAUSES  A contusion is usually caused by a blow, trauma, or direct force to an area of the body. SYMPTOMS   Swelling and redness of the injured area.  Bruising of the injured area.  Tenderness and soreness of the injured area.  Pain. DIAGNOSIS  The diagnosis can be made by taking a history and physical exam. An X-ray, CT scan, or MRI may be needed to determine if there were any associated injuries, such as fractures. TREATMENT  Specific treatment will depend on what area of the body was injured. In general, the best treatment for a contusion is resting, icing, elevating, and applying cold compresses to the injured area. Over-the-counter medicines may also be recommended for pain control. Ask your caregiver what the best treatment is for your contusion. HOME CARE INSTRUCTIONS   Put ice on the injured area.  Put ice in a plastic bag.  Place a towel between your skin and the bag.  Leave the ice on for 15-20 minutes, 3-4 times a day, or as directed by your health care provider.  Only take over-the-counter or prescription medicines for pain, discomfort, or fever as directed by your caregiver. Your caregiver may recommend avoiding anti-inflammatory medicines (aspirin, ibuprofen, and naproxen) for 48 hours because these medicines may increase bruising.  Rest the injured area.  If possible, elevate the injured area to reduce  swelling. SEEK IMMEDIATE MEDICAL CARE IF:   You have increased bruising or swelling.  You have pain that is getting worse.  Your swelling or pain is not relieved with medicines. MAKE SURE YOU:   Understand these instructions.  Will watch your condition.  Will get help right away if you are not doing well or get worse. Document Released: 08/26/2005 Document Revised: 11/21/2013 Document Reviewed: 09/21/2011 [facility] Patient Information 2015 [place], [place]. This information is not intended to replace advice given to you by your health care provider. Make sure you discuss any questions you have with your health care provider.    RICE: Routine Care for Injuries The routine care of many injuries includes Rest, Ice, Compression, and Elevation (RICE). HOME CARE INSTRUCTIONS  Rest is needed to allow your body to heal. Routine activities can usually be resumed when comfortable. Injured tendons and bones can take up to 6 weeks to heal. Tendons are the cord-like structures that attach muscle to bone.  Ice following an injury helps keep the swelling down and reduces pain.  Put ice in a plastic bag.  Place a towel between your skin and the bag.  Leave the ice on for 15-20 minutes, 3-4 times a day, or as directed by your health care provider. Do this while awake, for the first 24 to 48 hours. After that, continue as directed by your caregiver.  Compression helps keep swelling down. It also gives support and helps with discomfort. If an elastic bandage has been applied, it should be removed and reapplied every 3 to  4 hours. It should not be applied tightly, but firmly enough to keep swelling down. Watch fingers or toes for swelling, bluish discoloration, coldness, numbness, or excessive pain. If any of these problems occur, remove the bandage and reapply loosely. Contact your caregiver if these problems continue.  Elevation helps reduce swelling and decreases pain. With extremities, such as the  arms, hands, legs, and feet, the injured area should be placed near or above the level of the heart, if possible. SEEK IMMEDIATE MEDICAL CARE IF:  You have persistent pain and swelling.  You develop redness, numbness, or unexpected weakness.  Your symptoms are getting worse rather than improving after several days. These symptoms may indicate that further evaluation or further X-rays are needed. Sometimes, X-rays may not show a small broken bone (fracture) until 1 week or 10 days later. Make a follow-up appointment with your caregiver. Ask when your X-ray results will be ready. Make sure you get your X-ray results. Document Released: 02/28/2001 Document Revised: 11/21/2013 Document Reviewed: 04/17/2011 [facility] Patient Information 2015 [place], [place]. This information is not intended to replace advice given to you by your health care provider. Make sure you discuss any questions you have with your health care provider.

## 2015-05-02 ENCOUNTER — Emergency Department (HOSPITAL_BASED_OUTPATIENT_CLINIC_OR_DEPARTMENT_OTHER)
Admission: EM | Admit: 2015-05-02 | Discharge: 2015-05-02 | Disposition: A | Discharge status: Home / Self Care | Payer: No Typology Code available for payment source | Attending: Emergency Medicine | Admitting: Emergency Medicine

## 2015-05-02 ENCOUNTER — Encounter (HOSPITAL_BASED_OUTPATIENT_CLINIC_OR_DEPARTMENT_OTHER): Payer: Self-pay | Admitting: *Deleted

## 2015-05-02 DIAGNOSIS — Z79899 Other long term (current) drug therapy: Secondary | ICD-10-CM | POA: Diagnosis not present

## 2015-05-02 DIAGNOSIS — K219 Gastro-esophageal reflux disease without esophagitis: Secondary | ICD-10-CM | POA: Insufficient documentation

## 2015-05-02 DIAGNOSIS — Y998 Other external cause status: Secondary | ICD-10-CM | POA: Insufficient documentation

## 2015-05-02 DIAGNOSIS — X58XXXA Exposure to other specified factors, initial encounter: Secondary | ICD-10-CM | POA: Insufficient documentation

## 2015-05-02 DIAGNOSIS — Z880 Allergy status to penicillin: Secondary | ICD-10-CM | POA: Diagnosis not present

## 2015-05-02 DIAGNOSIS — J45901 Unspecified asthma with (acute) exacerbation: Secondary | ICD-10-CM | POA: Diagnosis not present

## 2015-05-02 DIAGNOSIS — Z8669 Personal history of other diseases of the nervous system and sense organs: Secondary | ICD-10-CM | POA: Insufficient documentation

## 2015-05-02 DIAGNOSIS — T7840XA Allergy, unspecified, initial encounter: Secondary | ICD-10-CM | POA: Diagnosis present

## 2015-05-02 DIAGNOSIS — Y9389 Activity, other specified: Secondary | ICD-10-CM | POA: Diagnosis not present

## 2015-05-02 DIAGNOSIS — F319 Bipolar disorder, unspecified: Secondary | ICD-10-CM | POA: Diagnosis not present

## 2015-05-02 DIAGNOSIS — Y9289 Other specified places as the place of occurrence of the external cause: Secondary | ICD-10-CM | POA: Insufficient documentation

## 2015-05-02 MED ORDER — FAMOTIDINE 20 MG PO TABS
20.0000 mg | ORAL_TABLET | Freq: Once | ORAL | Status: AC
  Administered 2015-05-02: 20 mg via ORAL
  Filled 2015-05-02: qty 1

## 2015-05-02 MED ORDER — FAMOTIDINE 20 MG PO TABS: 20.0000 mg | ORAL_TABLET | Freq: Two times a day (BID) | ORAL | Status: DC

## 2015-05-02 NOTE — ED Notes (Addendum)
Pt reports that she is having an allergic reaction to lactate to eat a milkshake.  No respiratory distress noted, BS clear bilaterally.  Reports that she feels tired and 'lifeless', and itching all over.  Ambulatory without difficulty. A/O x 4.

## 2015-05-02 NOTE — Discharge Instructions (Signed)
Since she can't take Benadryl. Take the Pepcid as directed for the next 7 days. Return for any new or worse symptoms. Return for any lip swelling tongue swelling or trouble breathing.
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
## 2015-05-23 ENCOUNTER — Emergency Department (HOSPITAL_BASED_OUTPATIENT_CLINIC_OR_DEPARTMENT_OTHER)
Admission: EM | Admit: 2015-05-23 | Discharge: 2015-05-23 | Disposition: A | Discharge status: Home / Self Care | Payer: No Typology Code available for payment source | Attending: Emergency Medicine | Admitting: Emergency Medicine

## 2015-05-23 ENCOUNTER — Encounter (HOSPITAL_BASED_OUTPATIENT_CLINIC_OR_DEPARTMENT_OTHER): Payer: Self-pay

## 2015-05-23 DIAGNOSIS — Z880 Allergy status to penicillin: Secondary | ICD-10-CM | POA: Insufficient documentation

## 2015-05-23 DIAGNOSIS — G4733 Obstructive sleep apnea (adult) (pediatric): Secondary | ICD-10-CM | POA: Insufficient documentation

## 2015-05-23 DIAGNOSIS — M79662 Pain in left lower leg: Secondary | ICD-10-CM | POA: Insufficient documentation

## 2015-05-23 DIAGNOSIS — Z87828 Personal history of other (healed) physical injury and trauma: Secondary | ICD-10-CM | POA: Insufficient documentation

## 2015-05-23 DIAGNOSIS — Z79899 Other long term (current) drug therapy: Secondary | ICD-10-CM | POA: Insufficient documentation

## 2015-05-23 DIAGNOSIS — M79661 Pain in right lower leg: Secondary | ICD-10-CM | POA: Diagnosis present

## 2015-05-23 DIAGNOSIS — J45909 Unspecified asthma, uncomplicated: Secondary | ICD-10-CM | POA: Diagnosis not present

## 2015-05-23 DIAGNOSIS — M79651 Pain in right thigh: Secondary | ICD-10-CM | POA: Insufficient documentation

## 2015-05-23 DIAGNOSIS — M79604 Pain in right leg: Secondary | ICD-10-CM

## 2015-05-23 DIAGNOSIS — K219 Gastro-esophageal reflux disease without esophagitis: Secondary | ICD-10-CM | POA: Insufficient documentation

## 2015-05-23 DIAGNOSIS — Z9981 Dependence on supplemental oxygen: Secondary | ICD-10-CM | POA: Insufficient documentation

## 2015-05-23 DIAGNOSIS — M79605 Pain in left leg: Secondary | ICD-10-CM

## 2015-05-23 DIAGNOSIS — E079 Disorder of thyroid, unspecified: Secondary | ICD-10-CM | POA: Diagnosis not present

## 2015-05-23 DIAGNOSIS — F319 Bipolar disorder, unspecified: Secondary | ICD-10-CM | POA: Diagnosis not present

## 2015-05-23 LAB — D-DIMER, QUANTITATIVE: D-Dimer, Quant: 0.33 ug/mL-FEU (ref 0.00–0.48)

## 2015-05-23 NOTE — ED Notes (Addendum)
Pt states left leg cramping onset yesterday,  Rt leg pain woke her up this am  No deformity, swelling or bruising noted  Ambulatory without diff

## 2015-05-23 NOTE — ED Provider Notes (Signed)
CSN: [id number]     Arrival date & time 05/23/15  0231 History   First MD Initiated Contact with Patient 05/23/15 0330     Chief Complaint  Patient presents with  . Leg Pain     (Consider location/radiation/quality/duration/timing/severity/associated sxs/prior Treatment) HPI  This is a 39 year old female with a history of psychiatric illness and multiple visits to the ED. She is here with pain in her left calf which began yesterday morning. She describes it as a cramp and is worse when she walks. It is moderate severity. She will this morning about 1 AM with similar pain in her right thigh. She is not sure if it is related to her fibromyalgia and she is worried she has a blood clot as she recently traveled to the [employer]. She took Tylenol without relief. She did not take her Percocet because "I'm afraid to take that stuff". She is not having shortness of breath or chest pain.  Past Medical History  Diagnosis Date  . GERD (gastroesophageal reflux disease)   . Thyroid disease   . Bipolar 1 disorder   . Morbid obesity   . IBS (irritable bowel syndrome)   . Whiplash   . OSA (obstructive sleep apnea)   . Asthma    Past Surgical History  Procedure Laterality Date  . Cholecystectomy    . Tympanostomy tube placement     No family history on file. History  Substance Use Topics  . Smoking status: Never Smoker   . Smokeless tobacco: Not on file  . Alcohol Use: No   OB History    No data available     Review of Systems  All other systems reviewed and are negative.   Allergies  Amoxapine and related; Amoxicillin; Aspartame and phenylalanine; Dilaudid; Doxycycline; Flagyl; Keflex; Keppra; Milk-related compounds; Neurontin; Other; Prednisone; Pristiq; Provigil; Topamax; and Toradol  Home Medications   Prior to Admission medications   Medication Sig Start Date End Date Taking? Authorizing Provider  clidinium-chlordiazePOXIDE (LIBRAX) 5-2.5 MG per capsule Take 1 capsule by  mouth.    Historical Provider, MD  clidinium-chlordiazePOXIDE (LIBRAX) 5-2.5 MG per capsule Take 1 capsule by mouth.    Historical Provider, MD  clonazePAM (KLONOPIN) 0.5 MG tablet Take 0.5 mg by mouth 3 (three) times daily.  at lunchtime only    Historical Provider, MD  cyclobenzaprine (FLEXERIL) 10 MG tablet Take 1 tablet (10 mg total) by mouth 2 (two) times daily as needed for muscle spasms. 03/18/15   Elwin Mocha, MD  Dexlansoprazole (DEXILANT PO) Take by mouth.    Historical Provider, MD  dicyclomine (BENTYL) 20 MG tablet Take 1 tablet (20 mg total) by mouth 2 (two) times daily. 09/04/14   April Palumbo, MD  diphenoxylate-atropine (LOMOTIL) 2.5-0.025 MG per tablet Take 1 tablet by mouth 4 (four) times daily as needed for diarrhea or loose stools. 02/10/15   Rolland Porter, MD  divalproex (DEPAKOTE) 500 MG DR tablet Take 500 mg by mouth at bedtime.     Historical Provider, MD  DULoxetine HCl (CYMBALTA PO) Take by mouth.    Historical Provider, MD  eszopiclone (LUNESTA) 2 MG TABS tablet Take 3 mg by mouth at bedtime as needed for sleep. Take immediately before bedtime    Historical Provider, MD  famotidine (PEPCID) 20 MG tablet Take 1 tablet (20 mg total) by mouth 2 (two) times daily. 02/02/15   April Palumbo, MD  famotidine (PEPCID) 20 MG tablet Take 1 tablet (20 mg total) by mouth 2 (two) times  daily. 05/02/15   Vanetta Mulders, MD  fluconazole (DIFLUCAN) 150 MG tablet Take 150 mg by mouth daily.    Historical Provider, MD  FLUoxetine HCl (PROZAC PO) Take by mouth.    Historical Provider, MD  Fluticasone Furoate-Vilanterol (BREO ELLIPTA) 100-25 MCG/INH AEPB Inhale into the lungs.    Historical Provider, MD  furosemide (LASIX) 20 MG tablet Take 2 tablets (40 mg total) by mouth daily. 05/03/14   Shon Baton, MD  hydrOXYzine (ATARAX/VISTARIL) 25 MG tablet Take 1 tablet (25 mg total) by mouth every 4 (four) hours as needed for itching. 05/11/14   Ferne Ellingwood, MD  ibuprofen (ADVIL,MOTRIN) 200 MG tablet  Take 200 mg by mouth every 6 (six) hours as needed.    Historical Provider, MD  levalbuterol [facility] HFA) 45 MCG/ACT inhaler Inhale into the lungs every 4 (four) hours as needed for wheezing.    Historical Provider, MD  levalbuterol [facility] HFA) 45 MCG/ACT inhaler Inhale into the lungs every 4 (four) hours as needed for wheezing.    Historical Provider, MD  levothyroxine (SYNTHROID, LEVOTHROID) 75 MCG tablet Take 88 mcg by mouth daily before breakfast.     Historical Provider, MD  lidocaine (LIDODERM) 5 % Place 1 patch onto the skin daily. Remove & Discard patch within 12 hours or as directed by MD 04/07/15   April Palumbo, MD  loratadine (CLARITIN) 10 MG tablet Take 10 mg by mouth daily.    Historical Provider, MD  MAGNESIUM PO Take by mouth.    Historical Provider, MD  magnesium sulfate (EPSOM SALTS) crystals Apply 1 application topically daily.    Historical Provider, MD  Melatonin 5 MG TABS Take 5 mg by mouth at bedtime.    Historical Provider, MD  methocarbamol (ROBAXIN) 500 MG tablet Take 1 tablet (500 mg total) by mouth 2 (two) times daily. 04/07/15   April Palumbo, MD  methylPREDNIsolone (MEDROL [facility]) 4 MG tablet follow package directions 02/02/15   April Palumbo, MD  Mirabegron (MYRBETRIQ PO) Take by mouth.    Historical Provider, MD  mirabegron ER (MYRBETRIQ) 50 MG TB24 tablet Take 50 mg by mouth daily.    Historical Provider, MD  montelukast (SINGULAIR) 10 MG tablet Take 10 mg by mouth at bedtime.    Historical Provider, MD  nitrofurantoin, macrocrystal-monohydrate, (MACROBID) 100 MG capsule Take 1 capsule (100 mg total) by mouth 2 (two) times daily. X 7 days 09/04/14   April Palumbo, MD  nitrofurantoin, macrocrystal-monohydrate, (MACROBID) 100 MG capsule Take 1 capsule (100 mg total) by mouth 2 (two) times daily. X 7 days 11/22/14   April Palumbo, MD  omeprazole (PRILOSEC) 20 MG capsule Take 1 capsule (20 mg total) by mouth daily. 07/22/14   April Palumbo, MD  ondansetron (ZOFRAN ODT) 4 MG  disintegrating tablet Take 1 tablet (4 mg total) by mouth every 8 (eight) hours as needed for nausea. 02/10/15   Rolland Porter, MD  ondansetron (ZOFRAN-ODT) 4 MG disintegrating tablet Take 1 tablet (4 mg total) by mouth every 8 (eight) hours as needed for nausea or vomiting. 03/18/15   Elwin Mocha, MD  ondansetron (ZOFRAN-ODT) 4 MG disintegrating tablet Take 4 mg by mouth every 8 (eight) hours as needed for nausea or vomiting.    Historical Provider, MD  orphenadrine (NORFLEX) 100 MG tablet Take 1 tablet (100 mg total) by mouth 2 (two) times daily. 05/18/14   Blane Ohara, MD  oxyCODONE-acetaminophen (PERCOCET) 5-325 MG per tablet Take 1-2 tablets by mouth every 6 (six) hours as needed (for  pain). 05/11/14   Paula Libra, MD  OXYGEN Inhale into the lungs. **With CPAP at night**    Historical Provider, MD  phenazopyridine (PYRIDIUM) 200 MG tablet Take 1 tablet (200 mg total) by mouth 3 (three) times daily. 09/04/14   April Palumbo, MD  phenazopyridine (PYRIDIUM) 200 MG tablet Take 1 tablet (200 mg total) by mouth 3 (three) times daily. 11/22/14   April Palumbo, MD  pindolol (VISKEN) 5 MG tablet Take 5 mg by mouth 2 (two) times daily.    Historical Provider, MD  Potassium Chloride CR (MICRO-K) 8 MEQ CPCR capsule CR Take 8 mEq by mouth.    Historical Provider, MD  primidone (MYSOLINE) 50 MG tablet Take by mouth 4 (four) times daily.    Historical Provider, MD  promethazine (PHENERGAN) 25 MG tablet Take 1 tablet (25 mg total) by mouth every 6 (six) hours as needed for nausea or vomiting. 02/10/15   Rolland Porter, MD  ranitidine (ZANTAC) 300 MG tablet Take 300 mg by mouth at bedtime.    Historical Provider, MD  risperidone (RISPERDAL) 4 MG tablet Take 2 mg by mouth daily.     Historical Provider, MD  terbinafine (LAMISIL AT) 1 % cream Apply to rash twice daily. 08/17/14   Lekesha Claw, MD  traZODone (DESYREL) 50 MG tablet Take 50 mg by mouth at bedtime.    Historical Provider, MD  zolmitriptan (ZOMIG-ZMT) 5 MG  disintegrating tablet Take 5 mg by mouth as needed for migraine.    Historical Provider, MD  Zolpidem Tartrate (AMBIEN PO) Take by mouth.    Historical Provider, MD   BP 118/65 mmHg  Pulse 81  Temp(Src) 98.6 F (37 C) (Oral)  Resp 20  Ht 5\' 2"  (1.575 m)  Wt 255 lb (115.667 kg)  BMI 46.63 kg/m2  SpO2 98%  Physical Exam  General: Well-developed, well-nourished female in no acute distress; appearance consistent with age of record HENT: normocephalic; atraumatic Eyes: pupils equal, round and reactive to light; extraocular muscles intact Neck: supple Heart: regular rate and rhythm Lungs: clear to auscultation bilaterally Abdomen: soft; nondistended; nontender; bowel sounds present Extremities: No deformity; full range of motion; pulses normal; no edema; tenderness of left calf without palpable cord, no erythema or warmth; tenderness of right anterior thigh, no erythema or warmth Neurologic: Awake, alert and oriented; motor function intact in all extremities and symmetric; no facial droop Skin: Warm and dry Psychiatric: Normal mood and affect    ED Course  Procedures (including critical care time)   MDM  Nursing notes and vitals signs, including pulse oximetry, reviewed.  Summary of this visit's results, reviewed by myself:  Labs:  Results for orders placed or performed during the hospital encounter of 05/23/15 (from the past 24 hour(s))  D-dimer, quantitative (not at [facility])     Status: None   Collection Time: 05/23/15  3:45 AM  Result Value Ref Range   D-Dimer, Quant 0.33 0.00 - 0.48 ug/mL-FEU       Paula Libra, MD 05/23/15 [phone]

## 2015-05-23 NOTE — ED Notes (Signed)
Pt c/o bilateral leg pain that woke her up at 0100am, denies any injuries

## 2015-05-29 ENCOUNTER — Encounter (HOSPITAL_BASED_OUTPATIENT_CLINIC_OR_DEPARTMENT_OTHER): Payer: Self-pay

## 2015-05-29 ENCOUNTER — Emergency Department (HOSPITAL_BASED_OUTPATIENT_CLINIC_OR_DEPARTMENT_OTHER)
Admission: EM | Admit: 2015-05-29 | Discharge: 2015-05-29 | Discharge status: Against Medical Advice | Payer: No Typology Code available for payment source | Attending: Emergency Medicine | Admitting: Emergency Medicine

## 2015-05-29 DIAGNOSIS — E079 Disorder of thyroid, unspecified: Secondary | ICD-10-CM | POA: Insufficient documentation

## 2015-05-29 DIAGNOSIS — Z79899 Other long term (current) drug therapy: Secondary | ICD-10-CM | POA: Diagnosis not present

## 2015-05-29 DIAGNOSIS — G4733 Obstructive sleep apnea (adult) (pediatric): Secondary | ICD-10-CM | POA: Insufficient documentation

## 2015-05-29 DIAGNOSIS — Z87828 Personal history of other (healed) physical injury and trauma: Secondary | ICD-10-CM | POA: Diagnosis not present

## 2015-05-29 DIAGNOSIS — K589 Irritable bowel syndrome without diarrhea: Secondary | ICD-10-CM | POA: Insufficient documentation

## 2015-05-29 DIAGNOSIS — Z880 Allergy status to penicillin: Secondary | ICD-10-CM | POA: Insufficient documentation

## 2015-05-29 DIAGNOSIS — M79644 Pain in right finger(s): Secondary | ICD-10-CM | POA: Diagnosis not present

## 2015-05-29 DIAGNOSIS — M79641 Pain in right hand: Secondary | ICD-10-CM | POA: Diagnosis present

## 2015-05-29 DIAGNOSIS — J45909 Unspecified asthma, uncomplicated: Secondary | ICD-10-CM | POA: Diagnosis not present

## 2015-05-29 DIAGNOSIS — K219 Gastro-esophageal reflux disease without esophagitis: Secondary | ICD-10-CM | POA: Diagnosis not present

## 2015-05-29 DIAGNOSIS — F319 Bipolar disorder, unspecified: Secondary | ICD-10-CM | POA: Diagnosis not present

## 2015-05-29 DIAGNOSIS — Z7952 Long term (current) use of systemic steroids: Secondary | ICD-10-CM | POA: Insufficient documentation

## 2015-05-29 NOTE — ED Notes (Signed)
Pain to right thumb x approx 30 min-driving when pain started-denies injury

## 2015-05-29 NOTE — ED Notes (Signed)
Pt at registration desk talking very loud and angry about no one helping her, as I have already spoke with pt about her concerns and reassured her that EDP Wofford said he would see her next. Pt became very loud step toward me very close to me stating she was telling everyone not to come here. I asked pt to step back and lower her voice, as she did not change I called security to help escort pt to her car. Pt started crying again and said she was leaving. Pt was in no distress and left willing.

## 2015-05-29 NOTE — ED Notes (Signed)
Pt came to nsg station stating that nobody was doing anything for her and all of the nurses were just sitting at the nurses station and doing nothing.  She stated that she wanted a survey.  Pt hollering loudly at nurses and stated that she is just going to go home.  Charge nurse went in pts room with her and talked with her a few minutes about her hand pain.  Pt began to cry.  Pt decided to stay.

## 2015-05-29 NOTE — ED Provider Notes (Signed)
CSN: [id number]     Arrival date & time 05/29/15  1908 History   First MD Initiated Contact with Patient 05/29/15 1912     Chief Complaint  Patient presents with  . Hand Pain     (Consider location/radiation/quality/duration/timing/severity/associated sxs/prior Treatment) Patient is a 39 y.o. female presenting with hand pain. The history is provided by the patient.  Hand Pain This is a new problem. The current episode started today. The problem occurs constantly. The problem has been unchanged. Pertinent negatives include no numbness or weakness.    Jessica Nicholson is a 39 yo F c/o right thumb pain.  Pain started about two hours ago. She denies any inciting event. Noticed the pain when she was driving. No prior injury. Pain is 6/10 and throbbing and sharp in nature. Pain radiates proximally to her mid forearm.  Worse with movement. Denies any numbness or tingling.  Pain is occuring on the dorsal aspect of her proximal phalanx.    Past Medical History  Diagnosis Date  . GERD (gastroesophageal reflux disease)   . Thyroid disease   . Bipolar 1 disorder   . Morbid obesity   . IBS (irritable bowel syndrome)   . Whiplash   . OSA (obstructive sleep apnea)   . Asthma    Past Surgical History  Procedure Laterality Date  . Cholecystectomy    . Tympanostomy tube placement     No family history on file. History  Substance Use Topics  . Smoking status: Never Smoker   . Smokeless tobacco: Not on file  . Alcohol Use: No   OB History    No data available     Review of Systems  Neurological: Negative for weakness and numbness.      Allergies  Amoxapine and related; Amoxicillin; Aspartame and phenylalanine; Dilaudid; Doxycycline; Flagyl; Keflex; Keppra; Milk-related compounds; Neurontin; Other; Prednisone; Pristiq; Provigil; Topamax; and Toradol  Home Medications   Prior to Admission medications   Medication Sig Start Date End Date Taking? Authorizing Provider    clidinium-chlordiazePOXIDE (LIBRAX) 5-2.5 MG per capsule Take 1 capsule by mouth.    Historical Provider, MD  clidinium-chlordiazePOXIDE (LIBRAX) 5-2.5 MG per capsule Take 1 capsule by mouth.    Historical Provider, MD  clonazePAM (KLONOPIN) 0.5 MG tablet Take 0.5 mg by mouth 3 (three) times daily.  at lunchtime only    Historical Provider, MD  cyclobenzaprine (FLEXERIL) 10 MG tablet Take 1 tablet (10 mg total) by mouth 2 (two) times daily as needed for muscle spasms. 03/18/15   Elwin Mocha, MD  Dexlansoprazole (DEXILANT PO) Take by mouth.    Historical Provider, MD  dicyclomine (BENTYL) 20 MG tablet Take 1 tablet (20 mg total) by mouth 2 (two) times daily. 09/04/14   April Palumbo, MD  diphenoxylate-atropine (LOMOTIL) 2.5-0.025 MG per tablet Take 1 tablet by mouth 4 (four) times daily as needed for diarrhea or loose stools. 02/10/15   Rolland Porter, MD  divalproex (DEPAKOTE) 500 MG DR tablet Take 500 mg by mouth at bedtime.     Historical Provider, MD  DULoxetine HCl (CYMBALTA PO) Take by mouth.    Historical Provider, MD  eszopiclone (LUNESTA) 2 MG TABS tablet Take 3 mg by mouth at bedtime as needed for sleep. Take immediately before bedtime    Historical Provider, MD  famotidine (PEPCID) 20 MG tablet Take 1 tablet (20 mg total) by mouth 2 (two) times daily. 02/02/15   April Palumbo, MD  famotidine (PEPCID) 20 MG tablet Take 1 tablet (20 mg  total) by mouth 2 (two) times daily. 05/02/15   Vanetta Mulders, MD  fluconazole (DIFLUCAN) 150 MG tablet Take 150 mg by mouth daily.    Historical Provider, MD  FLUoxetine HCl (PROZAC PO) Take by mouth.    Historical Provider, MD  Fluticasone Furoate-Vilanterol (BREO ELLIPTA) 100-25 MCG/INH AEPB Inhale into the lungs.    Historical Provider, MD  furosemide (LASIX) 20 MG tablet Take 2 tablets (40 mg total) by mouth daily. 05/03/14   Shon Baton, MD  hydrOXYzine (ATARAX/VISTARIL) 25 MG tablet Take 1 tablet (25 mg total) by mouth every 4 (four) hours as needed for  itching. 05/11/14   John Molpus, MD  ibuprofen (ADVIL,MOTRIN) 200 MG tablet Take 200 mg by mouth every 6 (six) hours as needed.    Historical Provider, MD  levalbuterol [facility] HFA) 45 MCG/ACT inhaler Inhale into the lungs every 4 (four) hours as needed for wheezing.    Historical Provider, MD  levalbuterol [facility] HFA) 45 MCG/ACT inhaler Inhale into the lungs every 4 (four) hours as needed for wheezing.    Historical Provider, MD  levothyroxine (SYNTHROID, LEVOTHROID) 75 MCG tablet Take 88 mcg by mouth daily before breakfast.     Historical Provider, MD  lidocaine (LIDODERM) 5 % Place 1 patch onto the skin daily. Remove & Discard patch within 12 hours or as directed by MD 04/07/15   April Palumbo, MD  loratadine (CLARITIN) 10 MG tablet Take 10 mg by mouth daily.    Historical Provider, MD  MAGNESIUM PO Take by mouth.    Historical Provider, MD  magnesium sulfate (EPSOM SALTS) crystals Apply 1 application topically daily.    Historical Provider, MD  Melatonin 5 MG TABS Take 5 mg by mouth at bedtime.    Historical Provider, MD  methocarbamol (ROBAXIN) 500 MG tablet Take 1 tablet (500 mg total) by mouth 2 (two) times daily. 04/07/15   April Palumbo, MD  methylPREDNIsolone (MEDROL [facility]) 4 MG tablet follow package directions 02/02/15   April Palumbo, MD  Mirabegron (MYRBETRIQ PO) Take by mouth.    Historical Provider, MD  mirabegron ER (MYRBETRIQ) 50 MG TB24 tablet Take 50 mg by mouth daily.    Historical Provider, MD  montelukast (SINGULAIR) 10 MG tablet Take 10 mg by mouth at bedtime.    Historical Provider, MD  nitrofurantoin, macrocrystal-monohydrate, (MACROBID) 100 MG capsule Take 1 capsule (100 mg total) by mouth 2 (two) times daily. X 7 days 09/04/14   April Palumbo, MD  nitrofurantoin, macrocrystal-monohydrate, (MACROBID) 100 MG capsule Take 1 capsule (100 mg total) by mouth 2 (two) times daily. X 7 days 11/22/14   April Palumbo, MD  omeprazole (PRILOSEC) 20 MG capsule Take 1 capsule (20 mg total)  by mouth daily. 07/22/14   April Palumbo, MD  ondansetron (ZOFRAN ODT) 4 MG disintegrating tablet Take 1 tablet (4 mg total) by mouth every 8 (eight) hours as needed for nausea. 02/10/15   Rolland Porter, MD  ondansetron (ZOFRAN-ODT) 4 MG disintegrating tablet Take 1 tablet (4 mg total) by mouth every 8 (eight) hours as needed for nausea or vomiting. 03/18/15   Elwin Mocha, MD  ondansetron (ZOFRAN-ODT) 4 MG disintegrating tablet Take 4 mg by mouth every 8 (eight) hours as needed for nausea or vomiting.    Historical Provider, MD  orphenadrine (NORFLEX) 100 MG tablet Take 1 tablet (100 mg total) by mouth 2 (two) times daily. 05/18/14   Blane Ohara, MD  oxyCODONE-acetaminophen (PERCOCET) 5-325 MG per tablet Take 1-2 tablets by mouth every  6 (six) hours as needed (for pain). 05/11/14   Paula LibraJohn Molpus, MD  OXYGEN Inhale into the lungs. **With CPAP at night**    Historical Provider, MD  phenazopyridine (PYRIDIUM) 200 MG tablet Take 1 tablet (200 mg total) by mouth 3 (three) times daily. 09/04/14   April Palumbo, MD  phenazopyridine (PYRIDIUM) 200 MG tablet Take 1 tablet (200 mg total) by mouth 3 (three) times daily. 11/22/14   April Palumbo, MD  pindolol (VISKEN) 5 MG tablet Take 5 mg by mouth 2 (two) times daily.    Historical Provider, MD  Potassium Chloride CR (MICRO-K) 8 MEQ CPCR capsule CR Take 8 mEq by mouth.    Historical Provider, MD  primidone (MYSOLINE) 50 MG tablet Take by mouth 4 (four) times daily.    Historical Provider, MD  promethazine (PHENERGAN) 25 MG tablet Take 1 tablet (25 mg total) by mouth every 6 (six) hours as needed for nausea or vomiting. 02/10/15   Rolland PorterMark James, MD  ranitidine (ZANTAC) 300 MG tablet Take 300 mg by mouth at bedtime.    Historical Provider, MD  risperidone (RISPERDAL) 4 MG tablet Take 2 mg by mouth daily.     Historical Provider, MD  terbinafine (LAMISIL AT) 1 % cream Apply to rash twice daily. 08/17/14   John Molpus, MD  traZODone (DESYREL) 50 MG tablet Take 50 mg by mouth at  bedtime.    Historical Provider, MD  zolmitriptan (ZOMIG-ZMT) 5 MG disintegrating tablet Take 5 mg by mouth as needed for migraine.    Historical Provider, MD  Zolpidem Tartrate (AMBIEN PO) Take by mouth.    Historical Provider, MD   BP 104/77 mmHg  Pulse 89  Temp(Src) 98.3 F (36.8 C) (Oral)  Resp 18  Ht 5\' 2"  (1.575 m)  Wt 255 lb (115.667 kg)  BMI 46.63 kg/m2  SpO2 99% Physical Exam  Constitutional: She is oriented to person, place, and time. She appears well-developed and well-nourished.  HENT:  Head: Normocephalic and atraumatic.  Eyes: Conjunctivae and EOM are normal.  Neck: Normal range of motion.  Cardiovascular: Normal rate.   Pulmonary/Chest: Effort normal.  Musculoskeletal:       Hands: Right hand: no erythema or ecchymosis  FROM in extension, flexion, ulnar and radial deviation   TTP along dorsal aspect of thumb on proximal phalanx  Normal pincher grasp  Normal thumb opposition  No snuffbox tenderness  Neurovascularly intact   Neurological: She is alert and oriented to person, place, and time.  Skin: Skin is warm. No rash noted.    ED Course  Procedures (including critical care time) Labs Review Labs Reviewed - No data to display  Imaging Review No results found.   EKG Interpretation None      MDM   Final diagnoses:  Thumb pain, right    Jessica Nicholson is p/w right thumb pain. Exam is reassuring with no snuffbox tenderness or any other obvious deformities on exam. There was no inciting event or injury. Patient became agitated and left suddenly.    Myra RudeJeremy E Schmitz, MD PGY-2, [facility] 05/29/2015, 9:27 PM      Myra RudeJeremy E Schmitz, MD 05/29/15 2127  Blake DivineJohn Wofford, MD 05/29/15 [phone]

## 2015-05-29 NOTE — ED Notes (Signed)
C/o pain to rt thumb and hand  Slight swelling noted,  Denies inj

## 2015-07-02 ENCOUNTER — Emergency Department (HOSPITAL_BASED_OUTPATIENT_CLINIC_OR_DEPARTMENT_OTHER)
Admission: EM | Admit: 2015-07-02 | Discharge: 2015-07-02 | Disposition: A | Discharge status: Home / Self Care | Payer: No Typology Code available for payment source | Attending: Emergency Medicine | Admitting: Emergency Medicine

## 2015-07-02 ENCOUNTER — Encounter (HOSPITAL_BASED_OUTPATIENT_CLINIC_OR_DEPARTMENT_OTHER): Payer: Self-pay | Admitting: *Deleted

## 2015-07-02 DIAGNOSIS — J45909 Unspecified asthma, uncomplicated: Secondary | ICD-10-CM | POA: Diagnosis not present

## 2015-07-02 DIAGNOSIS — E860 Dehydration: Secondary | ICD-10-CM | POA: Diagnosis not present

## 2015-07-02 DIAGNOSIS — R1033 Periumbilical pain: Secondary | ICD-10-CM | POA: Diagnosis present

## 2015-07-02 DIAGNOSIS — E079 Disorder of thyroid, unspecified: Secondary | ICD-10-CM | POA: Diagnosis not present

## 2015-07-02 DIAGNOSIS — K219 Gastro-esophageal reflux disease without esophagitis: Secondary | ICD-10-CM | POA: Diagnosis not present

## 2015-07-02 DIAGNOSIS — N898 Other specified noninflammatory disorders of vagina: Secondary | ICD-10-CM | POA: Diagnosis not present

## 2015-07-02 DIAGNOSIS — B379 Candidiasis, unspecified: Secondary | ICD-10-CM | POA: Diagnosis not present

## 2015-07-02 DIAGNOSIS — Z8669 Personal history of other diseases of the nervous system and sense organs: Secondary | ICD-10-CM | POA: Diagnosis not present

## 2015-07-02 DIAGNOSIS — Z87828 Personal history of other (healed) physical injury and trauma: Secondary | ICD-10-CM | POA: Insufficient documentation

## 2015-07-02 DIAGNOSIS — Z3202 Encounter for pregnancy test, result negative: Secondary | ICD-10-CM | POA: Insufficient documentation

## 2015-07-02 DIAGNOSIS — Z880 Allergy status to penicillin: Secondary | ICD-10-CM | POA: Insufficient documentation

## 2015-07-02 DIAGNOSIS — F319 Bipolar disorder, unspecified: Secondary | ICD-10-CM | POA: Diagnosis not present

## 2015-07-02 DIAGNOSIS — Z79899 Other long term (current) drug therapy: Secondary | ICD-10-CM | POA: Insufficient documentation

## 2015-07-02 LAB — CBC WITH DIFFERENTIAL/PLATELET
BASOS ABS: 0 10*3/uL (ref 0.0–0.1)
BASOS PCT: 0 % (ref 0–1)
EOS ABS: 0.2 10*3/uL (ref 0.0–0.7)
Eosinophils Relative: 2 % (ref 0–5)
HEMATOCRIT: 40.5 % (ref 36.0–46.0)
Hemoglobin: 12.5 g/dL (ref 12.0–15.0)
Lymphocytes Relative: 42 % (ref 12–46)
Lymphs Abs: 3.9 10*3/uL (ref 0.7–4.0)
MCH: 30 pg (ref 26.0–34.0)
MCHC: 30.9 g/dL (ref 30.0–36.0)
MCV: 97.4 fL (ref 78.0–100.0)
MONO ABS: 0.9 10*3/uL (ref 0.1–1.0)
Monocytes Relative: 10 % (ref 3–12)
NEUTROS ABS: 4.3 10*3/uL (ref 1.7–7.7)
NEUTROS PCT: 46 % (ref 43–77)
Platelets: 264 10*3/uL (ref 150–400)
RBC: 4.16 MIL/uL (ref 3.87–5.11)
RDW: 13.6 % (ref 11.5–15.5)
WBC: 9.3 10*3/uL (ref 4.0–10.5)

## 2015-07-02 LAB — COMPREHENSIVE METABOLIC PANEL
ALT: 26 U/L (ref 14–54)
AST: 30 U/L (ref 15–41)
Albumin: 3.6 g/dL (ref 3.5–5.0)
Alkaline Phosphatase: 61 U/L (ref 38–126)
Anion gap: 15 (ref 5–15)
BILIRUBIN TOTAL: 0.3 mg/dL (ref 0.3–1.2)
BUN: 8 mg/dL (ref 6–20)
CO2: 26 mmol/L (ref 22–32)
Calcium: 9.2 mg/dL (ref 8.9–10.3)
Chloride: 94 mmol/L — ABNORMAL LOW (ref 101–111)
Creatinine, Ser: 0.91 mg/dL (ref 0.44–1.00)
GFR calc Af Amer: >60 mL/min (ref >60)
GLUCOSE: 117 mg/dL — AB (ref 65–99)
Potassium: 3.4 mmol/L — ABNORMAL LOW (ref 3.5–5.1)
SODIUM: 135 mmol/L (ref 135–145)
Total Protein: 7.4 g/dL (ref 6.5–8.1)

## 2015-07-02 LAB — GC/CHLAMYDIA PROBE AMP (~~LOC~~) NOT AT ARMC
Chlamydia: NEGATIVE
Neisseria Gonorrhea: NEGATIVE

## 2015-07-02 LAB — I-STAT CHEM 8, ED
BUN: 9 mg/dL (ref 6–20)
CALCIUM ION: 1.13 mmol/L (ref 1.12–1.23)
CHLORIDE: 95 mmol/L — AB (ref 101–111)
Creatinine, Ser: 0.8 mg/dL (ref 0.44–1.00)
Glucose, Bld: 123 mg/dL — ABNORMAL HIGH (ref 65–99)
HCT: 41 % (ref 36.0–46.0)
HEMOGLOBIN: 13.9 g/dL (ref 12.0–15.0)
POTASSIUM: 3.4 mmol/L — AB (ref 3.5–5.1)
Sodium: 134 mmol/L — ABNORMAL LOW (ref 135–145)
TCO2: 25 mmol/L (ref 0–100)

## 2015-07-02 LAB — URINALYSIS, ROUTINE W REFLEX MICROSCOPIC
Glucose, UA: NEGATIVE mg/dL
Ketones, ur: 15 mg/dL — AB
Leukocytes, UA: NEGATIVE
Nitrite: NEGATIVE
PH: 5.5 (ref 5.0–8.0)
Protein, ur: NEGATIVE mg/dL
SPECIFIC GRAVITY, URINE: 1.031 — AB (ref 1.005–1.030)
Urobilinogen, UA: 1 mg/dL (ref 0.0–1.0)

## 2015-07-02 LAB — URINE MICROSCOPIC-ADD ON

## 2015-07-02 LAB — WET PREP, GENITAL
Clue Cells Wet Prep HPF POC: NONE SEEN
TRICH WET PREP: NONE SEEN
YEAST WET PREP: NONE SEEN

## 2015-07-02 LAB — PREGNANCY, URINE: Preg Test, Ur: NEGATIVE

## 2015-07-02 MED ORDER — PROMETHAZINE HCL 25 MG PO TABS: 25.0000 mg | ORAL_TABLET | Freq: Four times a day (QID) | ORAL | Status: DC | PRN

## 2015-07-02 MED ORDER — SODIUM CHLORIDE 0.9 % IV BOLUS (SEPSIS)
1000.0000 mL | Freq: Once | INTRAVENOUS | Status: AC
  Administered 2015-07-02: 1000 mL via INTRAVENOUS

## 2015-07-02 MED ORDER — FLUCONAZOLE 100 MG PO TABS
200.0000 mg | ORAL_TABLET | Freq: Once | ORAL | Status: AC
  Administered 2015-07-02: 200 mg via ORAL
  Filled 2015-07-02: qty 2

## 2015-07-02 MED ORDER — ONDANSETRON HCL 4 MG/2ML IJ SOLN
4.0000 mg | Freq: Once | INTRAMUSCULAR | Status: AC
  Administered 2015-07-02: 4 mg via INTRAVENOUS
  Filled 2015-07-02: qty 2

## 2015-07-02 NOTE — ED Notes (Signed)
Pt reports that she was seen at [facility] last week diagnosed with uti, started on macrobid, has 1 day left and continues with nausea and lower abdominal pain

## 2015-07-02 NOTE — ED Notes (Signed)
MD at bedside. 

## 2015-07-02 NOTE — ED Provider Notes (Addendum)
CSN: [id number]     Arrival date & time 07/02/15  0002 History  This chart was scribed for No att. providers found by Lyndel Safe, ED Scribe. This patient was seen in room MH01/MH01 and the patient's care was started 12:26 AM.   Chief Complaint  Patient presents with  . Urinary Tract Infection   The history is provided by the patient. No language interpreter was used.   HPI Comments: Jessica Nicholson is a 39 y.o. female, with a PShx of cholecystectomy,  who presents to the Emergency Department complaining of constant, moderate periumbilical abdominal pain, nausea, and mid to lower back pain that has been present for over a week. She reports an associated decrease in appetite and diarrhea. Pt additionally complains of a decrease in urine production, vaginal itching, burning, an abnormal discharge, but no dysuria. She reports 1 sexual partner, her husband, with whom she does not use protection with. Pt was diagnosed with a UTI at [facility] last week when she was discharged with [facility] prescription. Pt has been compliant with the antibiotic course and has 1 day left. She originally presented to [facility] with a migraine and generalized myalgias but no UTI symptoms. Denies fevers or vomiting. Furthermore, pt denies worsening of her abdominal pain since onset of her original UTI symptoms.    Past Medical History  Diagnosis Date  . GERD (gastroesophageal reflux disease)   . Thyroid disease   . Bipolar 1 disorder   . Morbid obesity   . IBS (irritable bowel syndrome)   . Whiplash   . OSA (obstructive sleep apnea)   . Asthma    Past Surgical History  Procedure Laterality Date  . Cholecystectomy    . Tympanostomy tube placement     History reviewed. No pertinent family history. History  Substance Use Topics  . Smoking status: Never Smoker   . Smokeless tobacco: Not on file  . Alcohol Use: No   OB History    No data available     Review of Systems  Constitutional: Positive  for appetite change. Negative for fever.  Gastrointestinal: Positive for nausea, abdominal pain and diarrhea. Negative for vomiting.  Genitourinary: Positive for dysuria, decreased urine volume and vaginal discharge.  All other systems reviewed and are negative.  Allergies  Amoxapine and related; Amoxicillin; Aspartame and phenylalanine; Dilaudid; Doxycycline; Flagyl; Keflex; Keppra; Milk-related compounds; Neurontin; Other; Prednisone; Pristiq; Provigil; Topamax; and Toradol  Home Medications   Prior to Admission medications   Medication Sig Start Date End Date Taking? Authorizing Provider  clidinium-chlordiazePOXIDE (LIBRAX) 5-2.5 MG per capsule Take 1 capsule by mouth.   Yes Historical Provider, MD  clonazePAM (KLONOPIN) 0.5 MG tablet Take 0.5 mg by mouth 3 (three) times daily.  at lunchtime only   Yes Historical Provider, MD  divalproex (DEPAKOTE) 500 MG DR tablet Take 1,500 mg by mouth at bedtime.    Yes Historical Provider, MD  DULoxetine HCl (CYMBALTA PO) Take by mouth.   Yes Historical Provider, MD  eszopiclone (LUNESTA) 2 MG TABS tablet Take 3 mg by mouth at bedtime as needed for sleep. Take immediately before bedtime   Yes Historical Provider, MD  famotidine (PEPCID) 20 MG tablet Take 1 tablet (20 mg total) by mouth 2 (two) times daily. 05/02/15  Yes Vanetta Mulders, MD  Fluticasone Furoate-Vilanterol (BREO ELLIPTA) 100-25 MCG/INH AEPB Inhale into the lungs.   Yes Historical Provider, MD  furosemide (LASIX) 20 MG tablet Take 2 tablets (40 mg total) by mouth daily. 05/03/14  Yes Shon Baton, MD  levothyroxine (SYNTHROID, LEVOTHROID) 75 MCG tablet Take 88 mcg by mouth daily before breakfast.    Yes Historical Provider, MD  loratadine (CLARITIN) 10 MG tablet Take 10 mg by mouth daily.   Yes Historical Provider, MD  MAGNESIUM PO Take by mouth.   Yes Historical Provider, MD  Melatonin 5 MG TABS Take 5 mg by mouth at bedtime.   Yes Historical Provider, MD  Mirabegron (MYRBETRIQ PO)  Take by mouth.   Yes Historical Provider, MD  montelukast (SINGULAIR) 10 MG tablet Take 10 mg by mouth at bedtime.   Yes Historical Provider, MD  nitrofurantoin, macrocrystal-monohydrate, (MACROBID) 100 MG capsule Take 1 capsule (100 mg total) by mouth 2 (two) times daily. X 7 days 11/22/14  Yes April Palumbo, MD  ondansetron (ZOFRAN-ODT) 4 MG disintegrating tablet Take 4 mg by mouth every 8 (eight) hours as needed for nausea or vomiting.   Yes Historical Provider, MD  OXYGEN Inhale into the lungs. **With CPAP at night**   Yes Historical Provider, MD  pindolol (VISKEN) 5 MG tablet Take 5 mg by mouth 2 (two) times daily.   Yes Historical Provider, MD  Potassium Chloride CR (MICRO-K) 8 MEQ CPCR capsule CR Take 8 mEq by mouth.   Yes Historical Provider, MD  primidone (MYSOLINE) 50 MG tablet Take by mouth 4 (four) times daily.   Yes Historical Provider, MD  QUEtiapine (SEROQUEL) 200 MG tablet Take 200 mg by mouth at bedtime.   Yes Historical Provider, MD  ranitidine (ZANTAC) 300 MG tablet Take 300 mg by mouth at bedtime.   Yes Historical Provider, MD  cyclobenzaprine (FLEXERIL) 10 MG tablet Take 1 tablet (10 mg total) by mouth 2 (two) times daily as needed for muscle spasms. 03/18/15   Elwin Mocha, MD  hydrOXYzine (ATARAX/VISTARIL) 25 MG tablet Take 1 tablet (25 mg total) by mouth every 4 (four) hours as needed for itching. 05/11/14   John Molpus, MD  levalbuterol [facility] HFA) 45 MCG/ACT inhaler Inhale into the lungs every 4 (four) hours as needed for wheezing.    Historical Provider, MD  omeprazole (PRILOSEC) 20 MG capsule Take 1 capsule (20 mg total) by mouth daily. 07/22/14   April Palumbo, MD  ondansetron (ZOFRAN-ODT) 4 MG disintegrating tablet Take 1 tablet (4 mg total) by mouth every 8 (eight) hours as needed for nausea or vomiting. 03/18/15   Elwin Mocha, MD   BP 110/64 mmHg  Pulse 84  Temp(Src) 98.1 F (36.7 C) (Oral)  Resp 20  Ht 5\' 2"  (1.575 m)  Wt 240 lb (108.863 kg)  BMI 43.89 kg/m2   SpO2 97% Physical Exam  Constitutional: She appears well-developed and well-nourished. No distress.  HENT:  Head: Normocephalic.  Dry mucous membranes.   Eyes: Conjunctivae are normal. Right eye exhibits no discharge. Left eye exhibits no discharge. No scleral icterus.  Neck: No JVD present.  Cardiovascular: Normal rate, regular rhythm and normal heart sounds.   Pulmonary/Chest: Effort normal and breath sounds normal. No respiratory distress. She has no wheezes.  Abdominal: Soft. Bowel sounds are normal. There is tenderness. There is no rebound and no guarding.  Bilateral CVA tenderness, minimal tenderness in mid abdomen without rebound or guarding.   Genitourinary: Uterus normal. Cervix exhibits no motion tenderness, no discharge and no friability. Right adnexum displays no mass, no tenderness and no fullness. Left adnexum displays no mass, no tenderness and no fullness. Vaginal discharge found.  Curd like vaginal discharge  Neurological: She is alert. Coordination  normal.  Skin: Skin is warm. No rash noted. No erythema. No pallor.  Psychiatric: She has a normal mood and affect. Her behavior is normal.  Nursing note and vitals reviewed.   ED Course  Procedures  DIAGNOSTIC STUDIES: Oxygen Saturation is 97% on RA, normal by my interpretation.    COORDINATION OF CARE: 12:36 AM Discussed treatment plan which includes to order diagnostic labs with pt. Pt acknowledges and agrees to plan.   Labs Review Labs Reviewed  WET PREP, GENITAL - Abnormal; Notable for the following:    WBC, Wet Prep HPF POC FEW (*)    All other components within normal limits  URINALYSIS, ROUTINE W REFLEX MICROSCOPIC (NOT AT [facility]) - Abnormal; Notable for the following:    Color, Urine AMBER (*)    APPearance CLOUDY (*)    Specific Gravity, Urine 1.031 (*)    Hgb urine dipstick MODERATE (*)    Bilirubin Urine SMALL (*)    Ketones, ur 15 (*)    All other components within normal limits  URINE MICROSCOPIC-ADD  ON - Abnormal; Notable for the following:    Squamous Epithelial / LPF MANY (*)    Bacteria, UA MANY (*)    All other components within normal limits  I-STAT CHEM 8, ED - Abnormal; Notable for the following:    Sodium 134 (*)    Potassium 3.4 (*)    Chloride 95 (*)    Glucose, Bld 123 (*)    All other components within normal limits  PREGNANCY, URINE  CBC WITH DIFFERENTIAL/PLATELET  COMPREHENSIVE METABOLIC PANEL  GC/CHLAMYDIA PROBE AMP ([place]) NOT AT [facility]    Imaging Review No results found.   EKG Interpretation None      MDM   Final diagnoses:  Yeast infection  Dehydration   patient with multiple medical problems who presents today with persistent nausea, mid abdominal pain and back pain. She was diagnosed with a UTI approximately 1 week ago at [facility] is been taking Macrobid since that time without improvement in her symptoms. She feels like the nausea is getting worse and she has a metallic taste in her mouth. Also in the last 3-4 days she's noted vaginal itching and discharge. On exam patient has only minimal abdominal pain. Umbilical region without guarding or rebound concern for diverticulitis, pancreatitis or appendicitis. She has had her gallbladder removed. UA today shows no signs of urinary tract infection and UPT is negative.  Pelvic exam shows some curd-like vaginal discharge and irritation without bleeding. Feel that patient most likely has a yeast infection from the recent and a biotic use. Given normal UA also feel that most likely the Macrobid as well as causing her nausea and some mild dehydration.  CBC and CMP pending to ensure no new acute renal insufficiency or other abnormalities.  Patient given IV fluids and Zofran.  1:53 AM Labs without acute findings. Will treat for increased infection have patient discontinue Vantin biotic. Will have her follow-up with her doctor if symptoms do not improve.  I personally performed the services described  in this documentation, which was scribed in my presence.  The recorded information has been reviewed and considered.    Gwyneth Sprout, MD 07/02/15 1610  Gwyneth Sprout, MD 07/02/15 [id number]

## 2015-07-02 NOTE — Discharge Instructions (Signed)

## 2015-07-02 NOTE — ED Notes (Signed)
Pt states that macrobid is causing a bad taste in her mouth and she is unable to sleep despite sleeping medications. Pt states that she "wants to make sure her kidneys are ok. She stated that kidney disease runs in her family and her uncle told her that if she developed a bad taste in her mouth to go to er to have her kidneys evaluted"

## 2015-07-07 ENCOUNTER — Encounter (HOSPITAL_BASED_OUTPATIENT_CLINIC_OR_DEPARTMENT_OTHER): Payer: Self-pay | Admitting: Adult Health

## 2015-07-07 ENCOUNTER — Emergency Department (HOSPITAL_BASED_OUTPATIENT_CLINIC_OR_DEPARTMENT_OTHER)
Admission: EM | Admit: 2015-07-07 | Discharge: 2015-07-07 | Disposition: A | Discharge status: Home / Self Care | Payer: No Typology Code available for payment source | Attending: Emergency Medicine | Admitting: Emergency Medicine

## 2015-07-07 ENCOUNTER — Emergency Department (HOSPITAL_BASED_OUTPATIENT_CLINIC_OR_DEPARTMENT_OTHER): Payer: No Typology Code available for payment source

## 2015-07-07 DIAGNOSIS — Z880 Allergy status to penicillin: Secondary | ICD-10-CM | POA: Diagnosis not present

## 2015-07-07 DIAGNOSIS — Z79899 Other long term (current) drug therapy: Secondary | ICD-10-CM | POA: Diagnosis not present

## 2015-07-07 DIAGNOSIS — R002 Palpitations: Secondary | ICD-10-CM | POA: Insufficient documentation

## 2015-07-07 DIAGNOSIS — R079 Chest pain, unspecified: Secondary | ICD-10-CM | POA: Diagnosis not present

## 2015-07-07 DIAGNOSIS — K219 Gastro-esophageal reflux disease without esophagitis: Secondary | ICD-10-CM | POA: Diagnosis not present

## 2015-07-07 DIAGNOSIS — R600 Localized edema: Secondary | ICD-10-CM | POA: Insufficient documentation

## 2015-07-07 DIAGNOSIS — Z8669 Personal history of other diseases of the nervous system and sense organs: Secondary | ICD-10-CM | POA: Diagnosis not present

## 2015-07-07 DIAGNOSIS — E079 Disorder of thyroid, unspecified: Secondary | ICD-10-CM | POA: Insufficient documentation

## 2015-07-07 DIAGNOSIS — J45909 Unspecified asthma, uncomplicated: Secondary | ICD-10-CM | POA: Diagnosis not present

## 2015-07-07 DIAGNOSIS — F319 Bipolar disorder, unspecified: Secondary | ICD-10-CM | POA: Insufficient documentation

## 2015-07-07 DIAGNOSIS — R110 Nausea: Secondary | ICD-10-CM | POA: Diagnosis not present

## 2015-07-07 LAB — BASIC METABOLIC PANEL
Anion gap: 11 (ref 5–15)
BUN: 6 mg/dL (ref 6–20)
CALCIUM: 8.8 mg/dL — AB (ref 8.9–10.3)
CO2: 26 mmol/L (ref 22–32)
Chloride: 98 mmol/L — ABNORMAL LOW (ref 101–111)
Creatinine, Ser: 0.67 mg/dL (ref 0.44–1.00)
GFR calc Af Amer: >60 mL/min (ref >60)
GFR calc non Af Amer: >60 mL/min (ref >60)
GLUCOSE: 111 mg/dL — AB (ref 65–99)
POTASSIUM: 3.7 mmol/L (ref 3.5–5.1)
Sodium: 135 mmol/L (ref 135–145)

## 2015-07-07 MED ORDER — ONDANSETRON 4 MG PO TBDP
4.0000 mg | ORAL_TABLET | Freq: Once | ORAL | Status: AC
  Administered 2015-07-07: 4 mg via ORAL
  Filled 2015-07-07: qty 1

## 2015-07-07 MED ORDER — IPRATROPIUM-ALBUTEROL 0.5-2.5 (3) MG/3ML IN SOLN
3.0000 mL | Freq: Four times a day (QID) | RESPIRATORY_TRACT | Status: DC
  Administered 2015-07-07: 3 mL via RESPIRATORY_TRACT
  Filled 2015-07-07: qty 3

## 2015-07-07 NOTE — Discharge Instructions (Signed)

## 2015-07-07 NOTE — ED Notes (Signed)
Present with sudden onset of heart racing and sternal chest pain described as heaviness and pressure made better by taking deep breaths and made worse by nothing, Endorses SOB, nausea, and diizziness.

## 2015-07-07 NOTE — ED Provider Notes (Signed)
CSN: [id number]     Arrival date & time 07/07/15  2018 History  This chart was scribed for Benjiman Core, MD by Budd Palmer, ED Scribe. This patient was seen in room MH04/MH04 and the patient's care was started at 8:38 PM.    Chief Complaint  Patient presents with  . Chest Pain   The history is provided by the patient. No language interpreter was used.   HPI Comments: Jessica Nicholson is a 39 y.o. female who presents to the Emergency Department complaining of chest pain with heart palpitations of sudden onset earlier today. She reports associated nausea. She has been stressed and anxious for the past few days. She has a PMHx of thyroid disease for which she has recently been seen. Pt denies chills and diaphoresis.  Past Medical History  Diagnosis Date  . GERD (gastroesophageal reflux disease)   . Thyroid disease   . Bipolar 1 disorder   . Morbid obesity   . IBS (irritable bowel syndrome)   . Whiplash   . OSA (obstructive sleep apnea)   . Asthma    Past Surgical History  Procedure Laterality Date  . Cholecystectomy    . Tympanostomy tube placement     History reviewed. No pertinent family history. Social History  Substance Use Topics  . Smoking status: Never Smoker   . Smokeless tobacco: None  . Alcohol Use: No   OB History    No data available     Review of Systems  Cardiovascular: Positive for chest pain and palpitations.  Gastrointestinal: Positive for nausea.  All other systems reviewed and are negative.   Allergies  Amoxapine and related; Amoxicillin; Aspartame and phenylalanine; Dilaudid; Doxycycline; Flagyl; Keflex; Keppra; Milk-related compounds; Neurontin; Other; Prednisone; Pristiq; Provigil; Topamax; and Toradol  Home Medications   Prior to Admission medications   Medication Sig Start Date End Date Taking? Authorizing Provider  alum & mag hydroxide-simeth (MAALOX/MYLANTA) 200-200-20 MG/5ML suspension Take by mouth every 6 (six) hours as needed for  indigestion or heartburn.   Yes Historical Provider, MD  sucralfate (CARAFATE) 1 G tablet Take 1 g by mouth 4 (four) times daily -  with meals and at bedtime.   Yes Historical Provider, MD  clidinium-chlordiazePOXIDE (LIBRAX) 5-2.5 MG per capsule Take 1 capsule by mouth.    Historical Provider, MD  clonazePAM (KLONOPIN) 0.5 MG tablet Take 0.5 mg by mouth 3 (three) times daily. 1mg  at lunchtime only    Historical Provider, MD  cyclobenzaprine (FLEXERIL) 10 MG tablet Take 1 tablet (10 mg total) by mouth 2 (two) times daily as needed for muscle spasms. 03/18/15   Elwin Mocha, MD  divalproex (DEPAKOTE) 500 MG DR tablet Take 1,500 mg by mouth at bedtime.     Historical Provider, MD  DULoxetine HCl (CYMBALTA PO) Take by mouth.    Historical Provider, MD  eszopiclone (LUNESTA) 2 MG TABS tablet Take 3 mg by mouth at bedtime as needed for sleep. Take immediately before bedtime    Historical Provider, MD  famotidine (PEPCID) 20 MG tablet Take 1 tablet (20 mg total) by mouth 2 (two) times daily. 05/02/15   Vanetta Mulders, MD  Fluticasone Furoate-Vilanterol (BREO ELLIPTA) 100-25 MCG/INH AEPB Inhale into the lungs.    Historical Provider, MD  furosemide (LASIX) 20 MG tablet Take 2 tablets (40 mg total) by mouth daily. 05/03/14   Shon Baton, MD  hydrOXYzine (ATARAX/VISTARIL) 25 MG tablet Take 1 tablet (25 mg total) by mouth every 4 (four) hours as needed for itching.  05/11/14   John Molpus, MD  levalbuterol [facility] HFA) 45 MCG/ACT inhaler Inhale into the lungs every 4 (four) hours as needed for wheezing.    Historical Provider, MD  levothyroxine (SYNTHROID, LEVOTHROID) 75 MCG tablet Take 88 mcg by mouth daily before breakfast.     Historical Provider, MD  loratadine (CLARITIN) 10 MG tablet Take 10 mg by mouth daily.    Historical Provider, MD  MAGNESIUM PO Take by mouth.    Historical Provider, MD  Melatonin 5 MG TABS Take 5 mg by mouth at bedtime.    Historical Provider, MD  Mirabegron (MYRBETRIQ PO) Take by  mouth.    Historical Provider, MD  montelukast (SINGULAIR) 10 MG tablet Take 10 mg by mouth at bedtime.    Historical Provider, MD  nitrofurantoin, macrocrystal-monohydrate, (MACROBID) 100 MG capsule Take 1 capsule (100 mg total) by mouth 2 (two) times daily. X 7 days 11/22/14   April Palumbo, MD  omeprazole (PRILOSEC) 20 MG capsule Take 1 capsule (20 mg total) by mouth daily. 07/22/14   April Palumbo, MD  ondansetron (ZOFRAN-ODT) 4 MG disintegrating tablet Take 1 tablet (4 mg total) by mouth every 8 (eight) hours as needed for nausea or vomiting. 03/18/15   Elwin Mocha, MD  ondansetron (ZOFRAN-ODT) 4 MG disintegrating tablet Take 4 mg by mouth every 8 (eight) hours as needed for nausea or vomiting.    Historical Provider, MD  OXYGEN Inhale into the lungs. **With CPAP at night**    Historical Provider, MD  pindolol (VISKEN) 5 MG tablet Take 5 mg by mouth 2 (two) times daily.    Historical Provider, MD  Potassium Chloride CR (MICRO-K) 8 MEQ CPCR capsule CR Take 8 mEq by mouth.    Historical Provider, MD  primidone (MYSOLINE) 50 MG tablet Take by mouth 4 (four) times daily.    Historical Provider, MD  promethazine (PHENERGAN) 25 MG tablet Take 1 tablet (25 mg total) by mouth every 6 (six) hours as needed for nausea or vomiting. 07/02/15   Gwyneth Sprout, MD  QUEtiapine (SEROQUEL) 200 MG tablet Take 200 mg by mouth at bedtime.    Historical Provider, MD  ranitidine (ZANTAC) 300 MG tablet Take 300 mg by mouth at bedtime.    Historical Provider, MD   BP 99/72 mmHg  Pulse 80  Temp(Src) 99.4 F (37.4 C) (Oral)  Resp 18  SpO2 96% Physical Exam  Constitutional: She is oriented to person, place, and time. She appears well-developed and well-nourished. No distress.  obese  HENT:  Head: Normocephalic and atraumatic.  Mouth/Throat: Oropharynx is clear and moist.  Eyes: Conjunctivae and EOM are normal. Pupils are equal, round, and reactive to light.  Neck: Normal range of motion. Neck supple. No tracheal  deviation present.  Cardiovascular: Normal rate.   Pulmonary/Chest: Breath sounds normal. No respiratory distress.  Abdominal: Soft.  Musculoskeletal: Normal range of motion. She exhibits edema.  Mild lower extremity edema  Neurological: She is alert and oriented to person, place, and time.  Skin: Skin is warm and dry.  Psychiatric: She has a normal mood and affect. Her behavior is normal.  Nursing note and vitals reviewed.   ED Course  Procedures  DIAGNOSTIC STUDIES: Oxygen Saturation is 95% on RA, adequate by my interpretation.    COORDINATION OF CARE: 8:41 PM - Discussed normal EKG. Discussed plans to order a chest XR. Advised pt to return to the ED if Sx worsen. Pt advised of plan for treatment and pt agrees.  Labs Review Labs  Reviewed  BASIC METABOLIC PANEL - Abnormal; Notable for the following:    Chloride 98 (*)    Glucose, Bld 111 (*)    Calcium 8.8 (*)    All other components within normal limits    Imaging Review No results found.   EKG Interpretation None      MDM   Final diagnoses:  Chest pain, unspecified chest pain type    Patient with chest pain. Normal EKG. Negative chest x-ray. Will discharge home. Doubt cardiac cause.   Date: 07/07/2015  Rate: 88  Rhythm: normal sinus rhythm  QRS Axis: normal  Intervals: normal  ST/T Wave abnormalities: normal  Conduction Disutrbances: none  Narrative Interpretation: unremarkable    I personally performed the services described in this documentation, which was scribed in my presence. The recorded information has been reviewed and is accurate.    Benjiman Core, MD 07/10/15 [phone]

## 2015-10-28 IMAGING — CT CT HEAD W/O CM
1 series · 16 of 30 positions shown, 20 images · non-contrast
Comparison: None.

CLINICAL DATA: Headache and dizziness since a fall 2 days ago at
which time she struck the back of her head.

EXAM:
CT HEAD WITHOUT CONTRAST
TECHNIQUE: Contiguous axial images were obtained from the base of the skull
through the vertex without intravenous contrast.

[Series 2: head 4.8 h37s · axial · 0.44mm/px · z∈[+1184,+1320]mm · 16 of 32 slices shown, 20 images]
[im 2/32  brain]
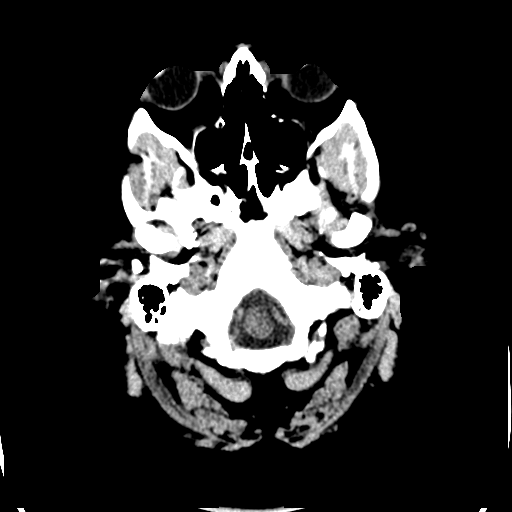
[im 2/32  bone]
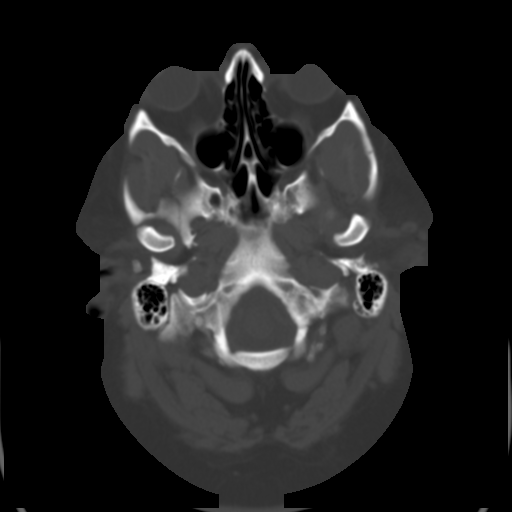
[im 4/32  brain]
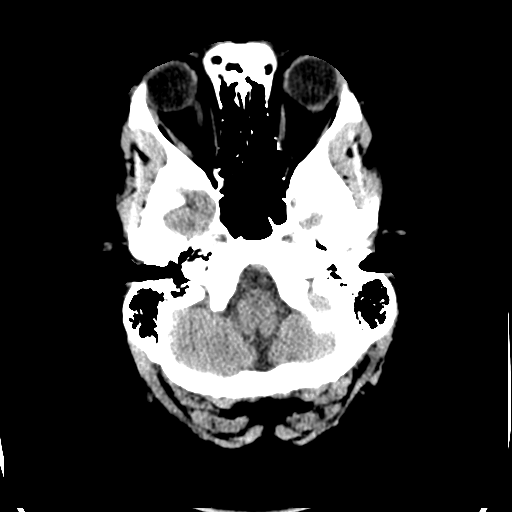
[im 6/32  brain]
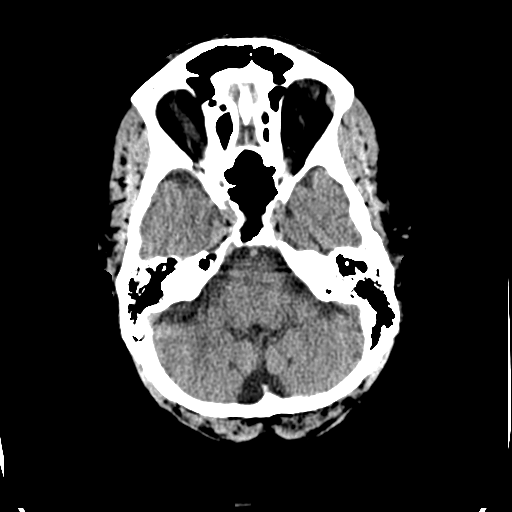
[im 8/32  brain]
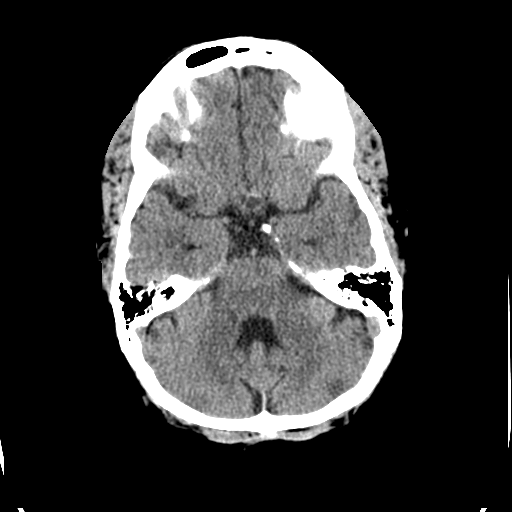
[im 9/32  brain]
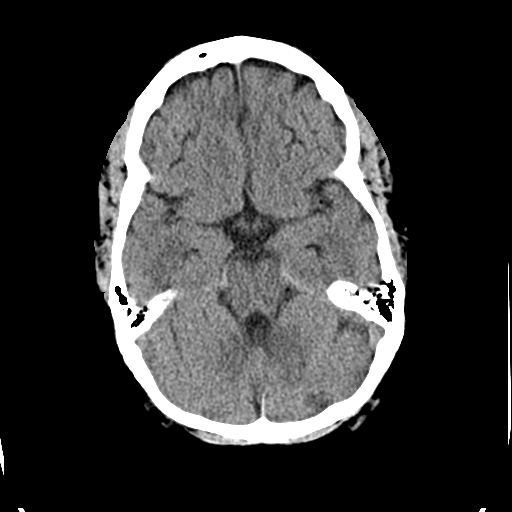
[im 9/32  bone]
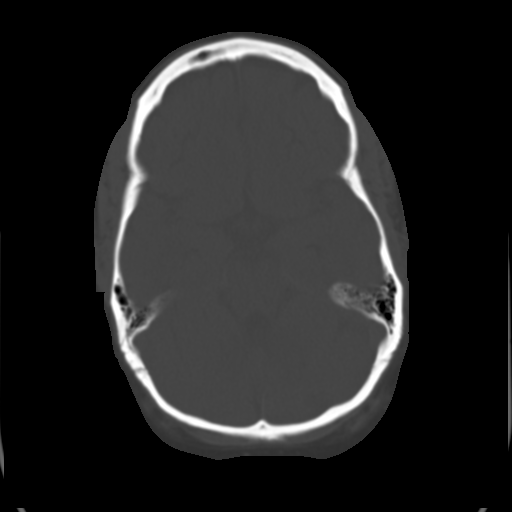
[im 11/32  brain]
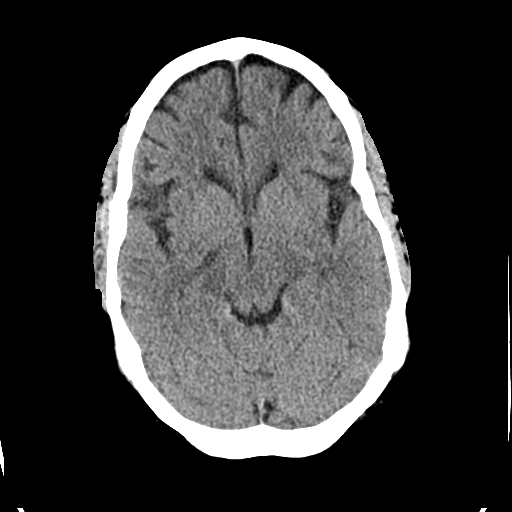
[im 13/32  brain]
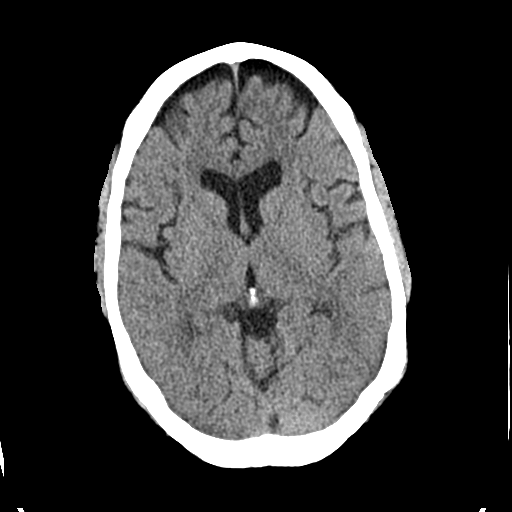
[im 15/32  brain]
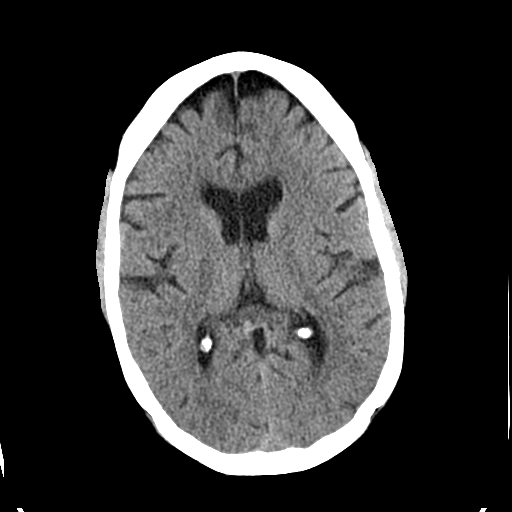
[im 17/32  brain]
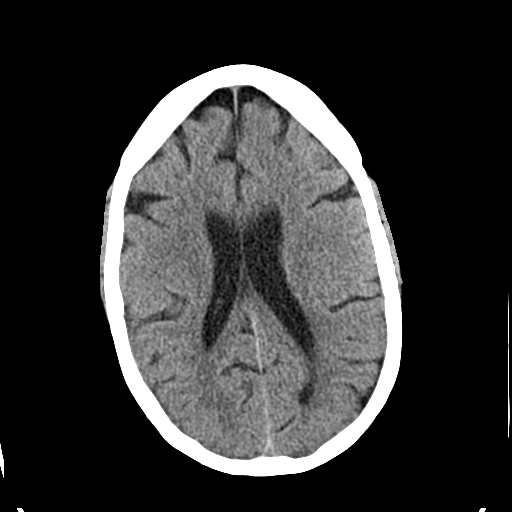
[im 17/32  bone]
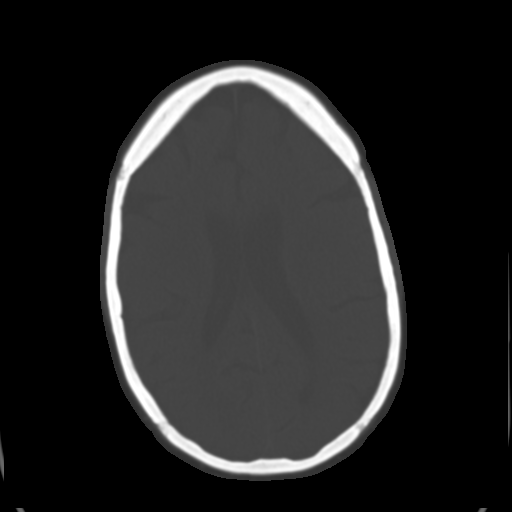
[im 19/32  brain]
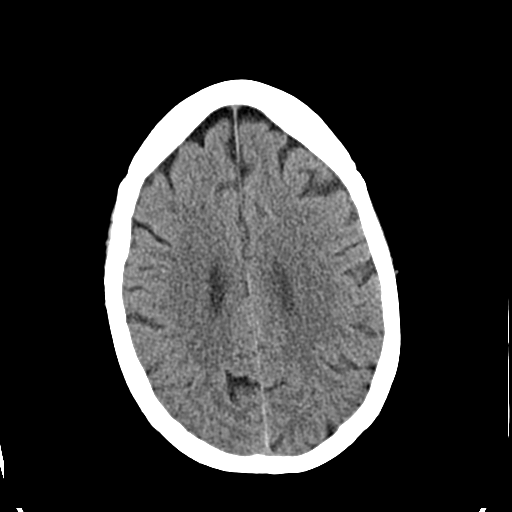
[im 21/32  brain]
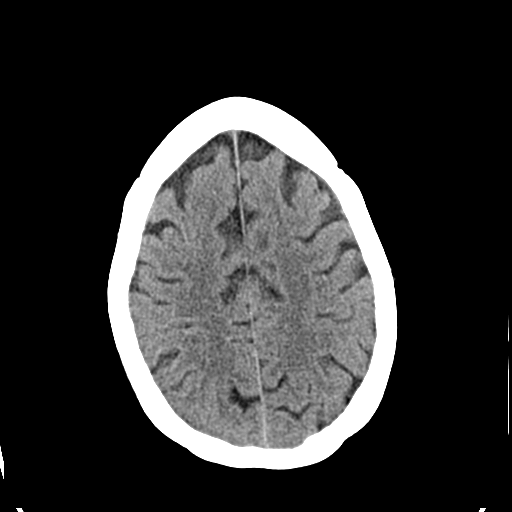
[im 23/32  brain]
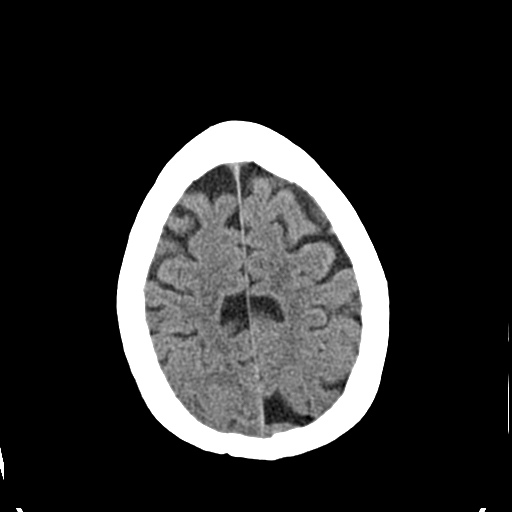
[im 24/32  brain]
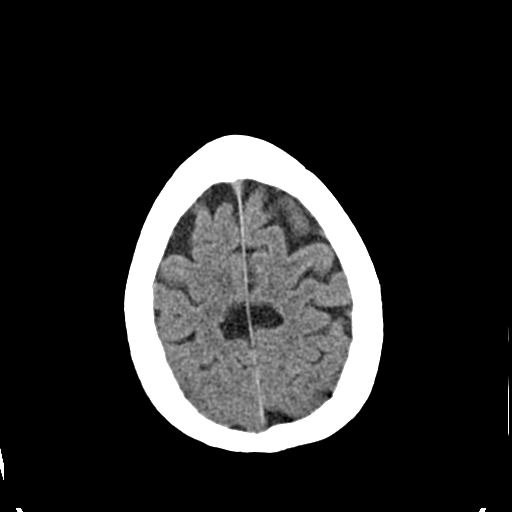
[im 24/32  bone]
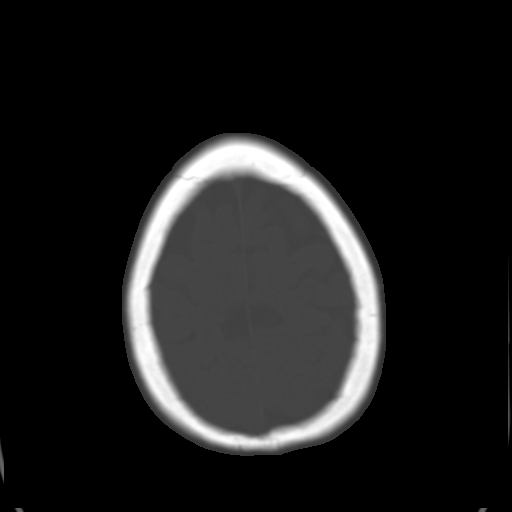
[im 26/32  brain]
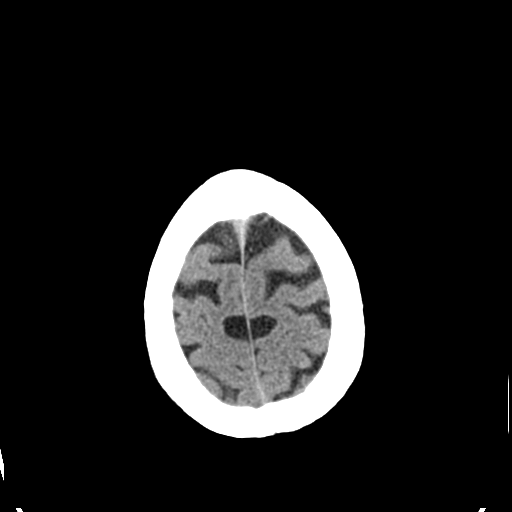
[im 28/32  brain]
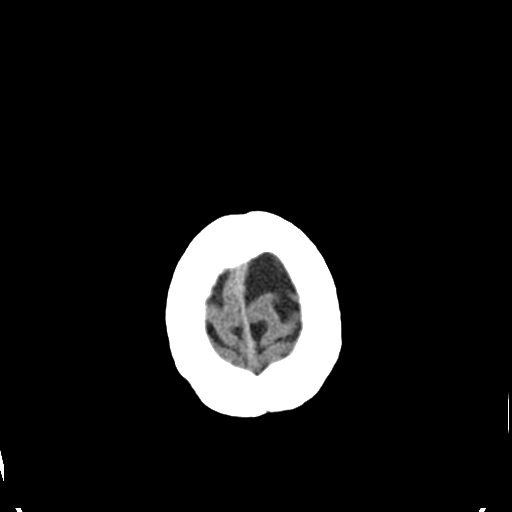
[im 30/32  brain]
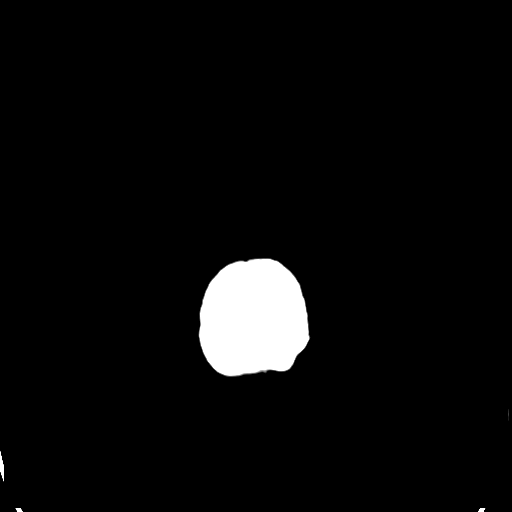

[16 of 30 positions shown; findings below may reference images not displayed]

FINDINGS: No mass lesion. No midline shift. No acute hemorrhage or hematoma.
No extra-axial fluid collections. No evidence of acute infarction.
There is slight diffuse atrophy. Brain parenchyma is otherwise
normal. No osseous abnormality.
IMPRESSION: No acute intracranial abnormality.  Slight diffuse atrophy.

## 2016-02-16 IMAGING — CR DG CHEST 2V
2 series · 2 of 2 positions shown · non-contrast
Comparison: Chest radiograph 09/04/2014

CLINICAL DATA: Patient with sub sternal chest pain.  Tachycardia.

EXAM:
CHEST  2 VIEW

[w chest pa]
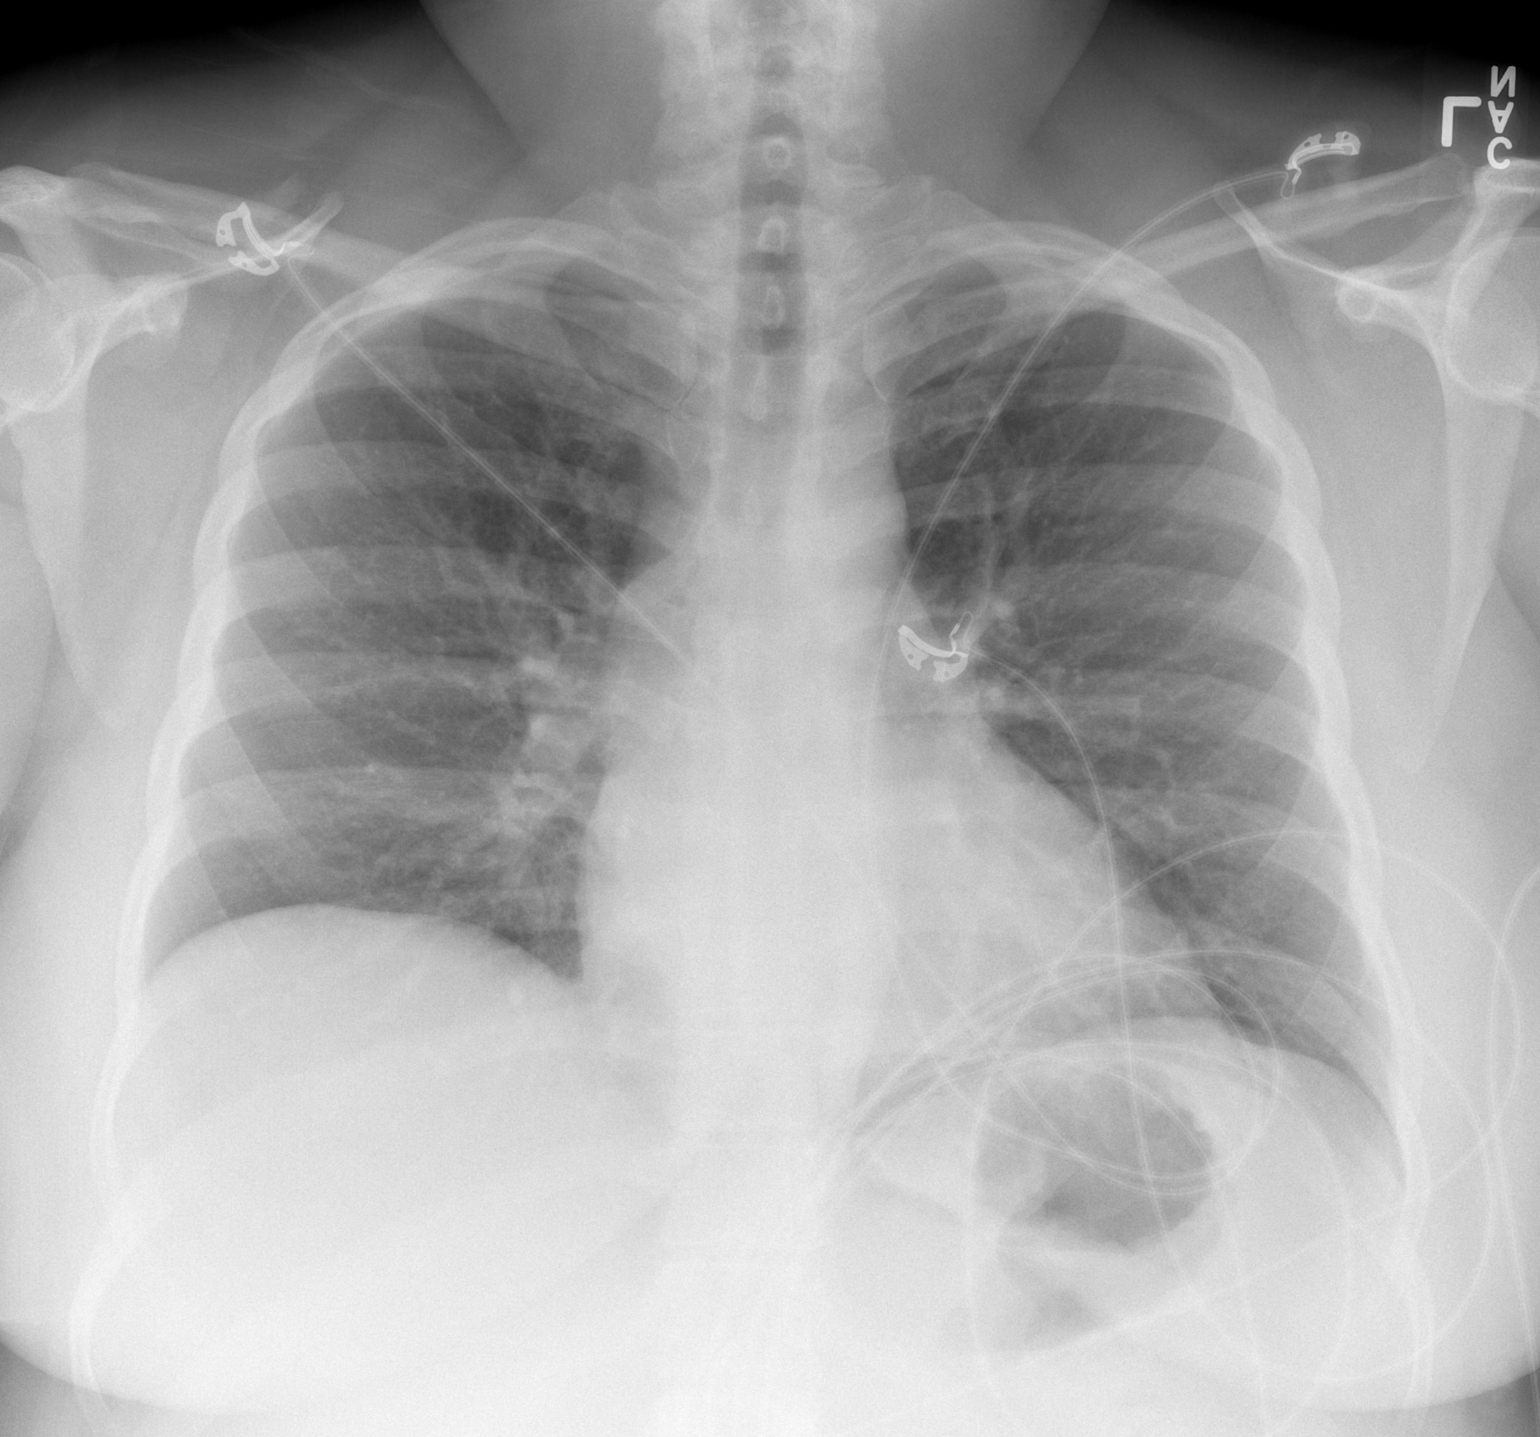

[w chest lat]
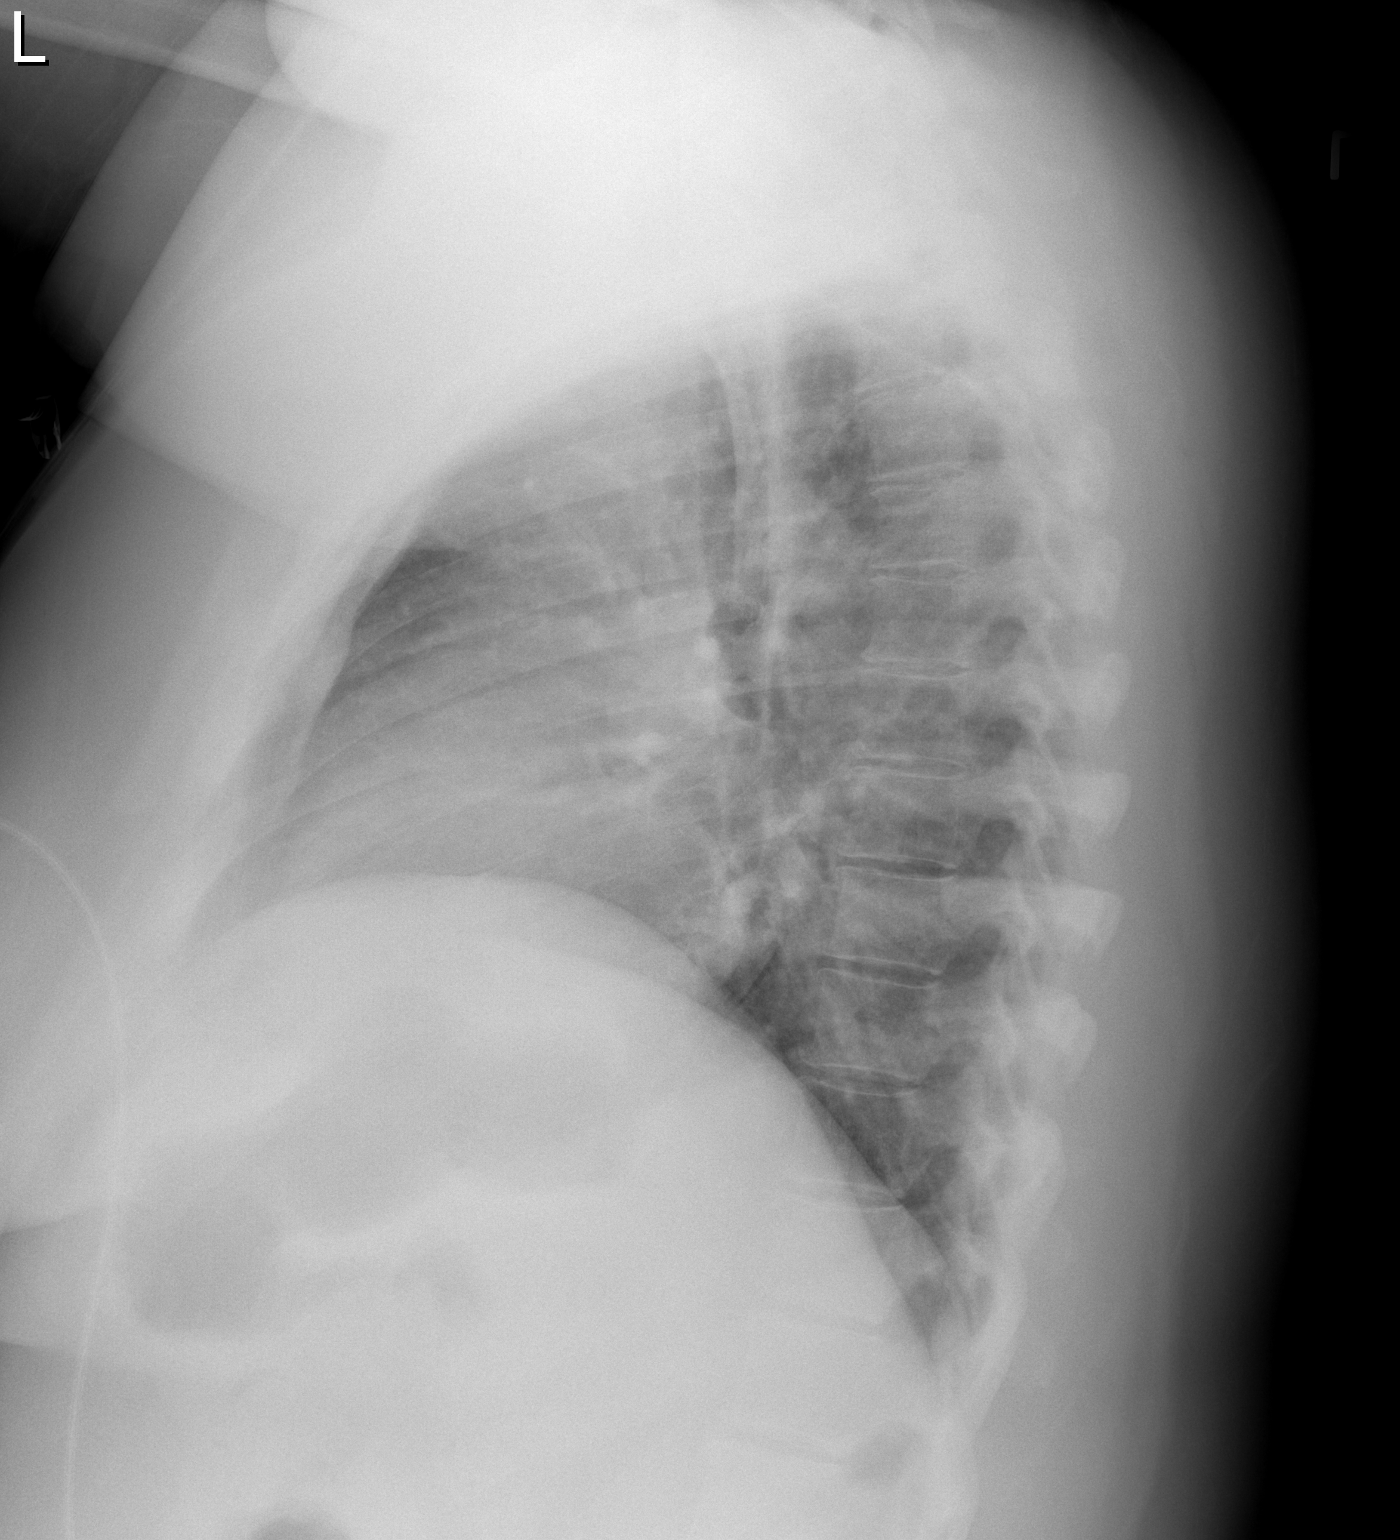

[2 of 2 positions shown; findings below may reference images not displayed]

FINDINGS: Monitoring leads overlie the patient. Stable cardiac and mediastinal
contours. No consolidative pulmonary opacities. No pleural effusion
or pneumothorax. Mid thoracic spine degenerative changes.
IMPRESSION: No acute cardiopulmonary process.

## 2017-03-12 ENCOUNTER — Encounter (HOSPITAL_BASED_OUTPATIENT_CLINIC_OR_DEPARTMENT_OTHER): Payer: Self-pay | Admitting: *Deleted

## 2017-03-12 ENCOUNTER — Emergency Department (HOSPITAL_BASED_OUTPATIENT_CLINIC_OR_DEPARTMENT_OTHER)
Admission: EM | Admit: 2017-03-12 | Discharge: 2017-03-12 | Disposition: A | Discharge status: Home / Self Care | Payer: BLUE CROSS/BLUE SHIELD | Attending: Emergency Medicine | Admitting: Emergency Medicine

## 2017-03-12 DIAGNOSIS — R197 Diarrhea, unspecified: Secondary | ICD-10-CM | POA: Diagnosis present

## 2017-03-12 DIAGNOSIS — J45909 Unspecified asthma, uncomplicated: Secondary | ICD-10-CM | POA: Insufficient documentation

## 2017-03-12 DIAGNOSIS — Z79899 Other long term (current) drug therapy: Secondary | ICD-10-CM | POA: Diagnosis not present

## 2017-03-12 DIAGNOSIS — K529 Noninfective gastroenteritis and colitis, unspecified: Secondary | ICD-10-CM | POA: Insufficient documentation

## 2017-03-12 LAB — CBC WITH DIFFERENTIAL/PLATELET
Basophils Absolute: 0 10*3/uL (ref 0.0–0.1)
Basophils Relative: 0 %
Eosinophils Absolute: 0.3 10*3/uL (ref 0.0–0.7)
Eosinophils Relative: 2 %
HCT: 37.3 % (ref 36.0–46.0)
Hemoglobin: 12.3 g/dL (ref 12.0–15.0)
LYMPHS ABS: 3.6 10*3/uL (ref 0.7–4.0)
Lymphocytes Relative: 31 %
MCH: 31.9 pg (ref 26.0–34.0)
MCHC: 33 g/dL (ref 30.0–36.0)
MCV: 96.9 fL (ref 78.0–100.0)
Monocytes Absolute: 1.1 10*3/uL — ABNORMAL HIGH (ref 0.1–1.0)
Monocytes Relative: 10 %
NEUTROS PCT: 57 %
Neutro Abs: 6.7 10*3/uL (ref 1.7–7.7)
Platelets: 263 10*3/uL (ref 150–400)
RBC: 3.85 MIL/uL — AB (ref 3.87–5.11)
RDW: 12.1 % (ref 11.5–15.5)
WBC: 11.8 10*3/uL — ABNORMAL HIGH (ref 4.0–10.5)

## 2017-03-12 LAB — URINALYSIS, ROUTINE W REFLEX MICROSCOPIC
Bilirubin Urine: NEGATIVE
Glucose, UA: NEGATIVE mg/dL
Ketones, ur: NEGATIVE mg/dL
NITRITE: NEGATIVE
PH: 6.5 (ref 5.0–8.0)
Protein, ur: NEGATIVE mg/dL
SPECIFIC GRAVITY, URINE: 1.015 (ref 1.005–1.030)

## 2017-03-12 LAB — COMPREHENSIVE METABOLIC PANEL
ALK PHOS: 51 U/L (ref 38–126)
ALT: 21 U/L (ref 14–54)
ANION GAP: 10 (ref 5–15)
AST: 26 U/L (ref 15–41)
Albumin: 3.7 g/dL (ref 3.5–5.0)
BILIRUBIN TOTAL: 0.6 mg/dL (ref 0.3–1.2)
BUN: 6 mg/dL (ref 6–20)
CALCIUM: 9.1 mg/dL (ref 8.9–10.3)
CO2: 25 mmol/L (ref 22–32)
Chloride: 98 mmol/L — ABNORMAL LOW (ref 101–111)
Creatinine, Ser: 0.68 mg/dL (ref 0.44–1.00)
GFR calc Af Amer: >60 mL/min (ref >60)
Glucose, Bld: 103 mg/dL — ABNORMAL HIGH (ref 65–99)
Potassium: 3.5 mmol/L (ref 3.5–5.1)
Sodium: 133 mmol/L — ABNORMAL LOW (ref 135–145)
TOTAL PROTEIN: 7.3 g/dL (ref 6.5–8.1)

## 2017-03-12 LAB — URINALYSIS, MICROSCOPIC (REFLEX)

## 2017-03-12 MED ORDER — ONDANSETRON HCL 4 MG/2ML IJ SOLN
4.0000 mg | Freq: Once | INTRAMUSCULAR | Status: AC
  Administered 2017-03-12: 4 mg via INTRAVENOUS
  Filled 2017-03-12: qty 2

## 2017-03-12 MED ORDER — SODIUM CHLORIDE 0.9 % IV BOLUS (SEPSIS)
1000.0000 mL | Freq: Once | INTRAVENOUS | Status: AC
  Administered 2017-03-12: 1000 mL via INTRAVENOUS

## 2017-03-12 NOTE — ED Triage Notes (Signed)
Pt c/o abd cramping land n/d  x 2 hrs

## 2017-03-12 NOTE — ED Provider Notes (Signed)
[facility] Provider Note: Lowella Dell, MD, FACEP  CSN: [id number] MRN: [id number] ARRIVAL: 03/12/17 at 0036 ROOM: [facility]   CHIEF COMPLAINT  Diarrhea   HISTORY OF PRESENT ILLNESS  Jessica Nicholson is a 41 y.o. female with a history of irritable bowel syndrome on Linzess. She was awakened about midnight with explosive diarrhea. It was severe enough that she was incontinent and soiled her clothing. She had multiple liquid foul-smelling stools prior to arrival. This is associated with abdominal cramping which was moderate but has improved. There was associated nausea but no vomiting. She was given Zofran and IV fluid bolus prior to my evaluation and feels better. She has had minimal diarrhea while in the department.   Past Medical History:  Diagnosis Date  . Asthma   . Bipolar 1 disorder (HCC)   . GERD (gastroesophageal reflux disease)   . IBS (irritable bowel syndrome)   . Morbid obesity (HCC)   . OSA (obstructive sleep apnea)   . Thyroid disease   . Whiplash     Past Surgical History:  Procedure Laterality Date  . CHOLECYSTECTOMY    . TYMPANOSTOMY TUBE PLACEMENT      No family history on file.  Social History  Substance Use Topics  . Smoking status: Never Smoker  . Smokeless tobacco: Not on file  . Alcohol use No    Prior to Admission medications   Medication Sig Start Date End Date Taking? Authorizing Provider  clonazePAM (KLONOPIN) 0.5 MG tablet Take 0.5 mg by mouth 2 (two) times daily as needed for anxiety.   Yes Historical Provider, MD  Dexlansoprazole (DEXILANT PO) Take by mouth.   Yes Historical Provider, MD  folic acid (FOLVITE) 1 MG tablet Take 1 mg by mouth daily.   Yes Historical Provider, MD  levothyroxine (SYNTHROID, LEVOTHROID) 100 MCG tablet Take 100 mcg by mouth daily before breakfast.   Yes Historical Provider, MD  Linaclotide (LINZESS PO) Take by mouth.   Yes Historical Provider, MD  lithium 300 MG tablet Take 300 mg by mouth 3 (three)  times daily.   Yes Historical Provider, MD  meclizine (ANTIVERT) 25 MG tablet Take 25 mg by mouth 3 (three) times daily as needed for dizziness.   Yes Historical Provider, MD  oxybutynin (DITROPAN) 5 MG tablet Take 10 mg by mouth 3 (three) times daily.   Yes Historical Provider, MD  Probiotic Product (RESTORA PO) Take by mouth.   Yes Historical Provider, MD  sulindac (CLINORIL) 150 MG tablet Take 150 mg by mouth 2 (two) times daily.   Yes Historical Provider, MD  zolmitriptan (ZOMIG) 5 MG tablet Take 5 mg by mouth as needed for migraine.   Yes Historical Provider, MD  zolpidem (AMBIEN) 10 MG tablet Take 10 mg by mouth at bedtime as needed for sleep.   Yes Historical Provider, MD  alum & mag hydroxide-simeth (MAALOX/MYLANTA) 200-200-20 MG/5ML suspension Take by mouth every 6 (six) hours as needed for indigestion or heartburn.    Historical Provider, MD  clidinium-chlordiazePOXIDE (LIBRAX) 5-2.5 MG per capsule Take 1 capsule by mouth.    Historical Provider, MD  divalproex (DEPAKOTE) 500 MG DR tablet Take 1,500 mg by mouth at bedtime.     Historical Provider, MD  Fluticasone Furoate-Vilanterol (BREO ELLIPTA) 100-25 MCG/INH AEPB Inhale into the lungs.    Historical Provider, MD  furosemide (LASIX) 20 MG tablet Take 2 tablets (40 mg total) by mouth daily. 05/03/14   Shon Baton, MD  hydrOXYzine (ATARAX/VISTARIL) 25 MG  tablet Take 1 tablet (25 mg total) by mouth every 4 (four) hours as needed for itching. 05/11/14   Akhil Piscopo, MD  levalbuterol [facility] HFA) 45 MCG/ACT inhaler Inhale into the lungs every 4 (four) hours as needed for wheezing.    Historical Provider, MD  loratadine (CLARITIN) 10 MG tablet Take 10 mg by mouth daily.    Historical Provider, MD  MAGNESIUM PO Take by mouth.    Historical Provider, MD  Melatonin 5 MG TABS Take 5 mg by mouth at bedtime.    Historical Provider, MD  montelukast (SINGULAIR) 10 MG tablet Take 10 mg by mouth at bedtime.    Historical Provider, MD  omeprazole  (PRILOSEC) 20 MG capsule Take 1 capsule (20 mg total) by mouth daily. 07/22/14   April Palumbo, MD  ondansetron (ZOFRAN-ODT) 4 MG disintegrating tablet Take 1 tablet (4 mg total) by mouth every 8 (eight) hours as needed for nausea or vomiting. 03/18/15   Elwin Mocha, MD  ondansetron (ZOFRAN-ODT) 4 MG disintegrating tablet Take 4 mg by mouth every 8 (eight) hours as needed for nausea or vomiting.    Historical Provider, MD  OXYGEN Inhale into the lungs. **With CPAP at night**    Historical Provider, MD  pindolol (VISKEN) 5 MG tablet Take 5 mg by mouth 2 (two) times daily.    Historical Provider, MD  Potassium Chloride CR (MICRO-K) 8 MEQ CPCR capsule CR Take 8 mEq by mouth.    Historical Provider, MD  primidone (MYSOLINE) 50 MG tablet Take by mouth 4 (four) times daily.    Historical Provider, MD  QUEtiapine (SEROQUEL) 200 MG tablet Take 200 mg by mouth at bedtime.    Historical Provider, MD  ranitidine (ZANTAC) 300 MG tablet Take 300 mg by mouth at bedtime.    Historical Provider, MD  sucralfate (CARAFATE) 1 G tablet Take 1 g by mouth 4 (four) times daily -  with meals and at bedtime.    Historical Provider, MD    Allergies Amoxapine and related; Amoxicillin; Aspartame and phenylalanine; Ativan [lorazepam]; Belsomra [suvorexant]; Benadryl [diphenhydramine]; Clozapine; Dilaudid [hydromorphone hcl]; Doxycycline; Flagyl [metronidazole]; Haldol [haloperidol lactate]; Keflex [cephalexin]; Keppra [levetiracetam]; Lamisil [terbinafine hcl]; Milk-related compounds; Neurontin [gabapentin]; Other; Prednisone; Pristiq [desvenlafaxine]; Provigil [modafinil]; Topamax [topiramate]; and Toradol [ketorolac tromethamine]   REVIEW OF SYSTEMS  Negative except as noted here or in the History of Present Illness.   PHYSICAL EXAMINATION  Initial Vital Signs Blood pressure (!) 96/50, pulse 71, temperature 98.6 F (37 C), temperature source Oral, resp. rate 18, height  (1.6 m), weight 222 lb (100.7 kg), last  menstrual period 02/24/2017, SpO2 95 %.  Examination General: Well-developed, well-nourished female in no acute distress; appearance consistent with age of record HENT: normocephalic; atraumatic Eyes: pupils equal, round and reactive to light; extraocular muscles intact Neck: supple Heart: regular rate and rhythm Lungs: clear to auscultation bilaterally Abdomen: soft; nondistended; nontender; no masses or hepatosplenomegaly; bowel sounds present Extremities: No deformity; full range of motion; pulses normal Neurologic: Awake, alert and oriented; motor function intact in all extremities and symmetric; no facial droop Skin: Warm and dry Psychiatric: Normal mood and affect   RESULTS  Summary of this visit's results, reviewed by myself:   EKG Interpretation  Date/Time:    Ventricular Rate:    PR Interval:    QRS Duration:   QT Interval:    QTC Calculation:   R Axis:     Text Interpretation:        Laboratory Studies: Results for  orders placed or performed during the hospital encounter of 03/12/17 (from the past 24 hour(s))  CBC with Differential     Status: Abnormal   Collection Time: 03/12/17  2:20 AM  Result Value Ref Range   WBC 11.8 (H) 4.0 - 10.5 K/uL   RBC 3.85 (L) 3.87 - 5.11 MIL/uL   Hemoglobin 12.3 12.0 - 15.0 g/dL   HCT 82.9 56.2 - 13.0 %   MCV 96.9 78.0 - 100.0 fL   MCH 31.9 26.0 - 34.0 pg   MCHC 33.0 30.0 - 36.0 g/dL   RDW 86.5 78.4 - 69.6 %   Platelets 263 150 - 400 K/uL   Neutrophils Relative % 57 %   Neutro Abs 6.7 1.7 - 7.7 K/uL   Lymphocytes Relative 31 %   Lymphs Abs 3.6 0.7 - 4.0 K/uL   Monocytes Relative 10 %   Monocytes Absolute 1.1 (H) 0.1 - 1.0 K/uL   Eosinophils Relative 2 %   Eosinophils Absolute 0.3 0.0 - 0.7 K/uL   Basophils Relative 0 %   Basophils Absolute 0.0 0.0 - 0.1 K/uL  Comprehensive metabolic panel     Status: Abnormal   Collection Time: 03/12/17  2:20 AM  Result Value Ref Range   Sodium 133 (L) 135 - 145 mmol/L   Potassium  3.5 3.5 - 5.1 mmol/L   Chloride 98 (L) 101 - 111 mmol/L   CO2 25 22 - 32 mmol/L   Glucose, Bld 103 (H) 65 - 99 mg/dL   BUN 6 6 - 20 mg/dL   Creatinine, Ser 2.95 0.44 - 1.00 mg/dL   Calcium 9.1 8.9 - 28.4 mg/dL   Total Protein 7.3 6.5 - 8.1 g/dL   Albumin 3.7 3.5 - 5.0 g/dL   AST 26 15 - 41 U/L   ALT 21 14 - 54 U/L   Alkaline Phosphatase 51 38 - 126 U/L   Total Bilirubin 0.6 0.3 - 1.2 mg/dL   GFR calc non Af Amer >60 >60 mL/min   GFR calc Af Amer >60 >60 mL/min   Anion gap 10 5 - 15  Urinalysis, Routine w reflex microscopic     Status: Abnormal   Collection Time: 03/12/17  2:47 AM  Result Value Ref Range   Color, Urine YELLOW YELLOW   APPearance CLOUDY (A) CLEAR   Specific Gravity, Urine 1.015 1.005 - 1.030   pH 6.5 5.0 - 8.0   Glucose, UA NEGATIVE NEGATIVE mg/dL   Hgb urine dipstick SMALL (A) NEGATIVE   Bilirubin Urine NEGATIVE NEGATIVE   Ketones, ur NEGATIVE NEGATIVE mg/dL   Protein, ur NEGATIVE NEGATIVE mg/dL   Nitrite NEGATIVE NEGATIVE   Leukocytes, UA MODERATE (A) NEGATIVE  Urinalysis, Microscopic (reflex)     Status: Abnormal   Collection Time: 03/12/17  2:47 AM  Result Value Ref Range   RBC / HPF 0-5 0 - 5 RBC/hpf   WBC, UA 6-30 0 - 5 WBC/hpf   Bacteria, UA MANY (A) NONE SEEN   Squamous Epithelial / LPF 6-30 (A) NONE SEEN   Hyaline Casts, UA PRESENT    Imaging Studies: No results found.  ED COURSE  Nursing notes and initial vitals signs, including pulse oximetry, reviewed.  Vitals:   03/12/17 0041 03/12/17 0042 03/12/17 0314  BP:  120/85 (!) 96/50  Pulse:  80 71  Resp:  20 18  Temp:  98.6 F (37 C)   TempSrc:  Oral   SpO2:  99% 95%  Weight: 222 lb (100.7 kg)  Height:  (1.6 m)     Given patient's long-standing history of irritable bowel syndrome and her treatment with Linzess she was advised against using over-the-counter antidiarrheals without consultation with her gastroenterologist.  PROCEDURES    ED DIAGNOSES     ICD-9-CM ICD-10-CM   1.  Gastroenteritis 558.9 K52.9        Paula Libra, MD 03/12/17 [phone]

## 2017-03-12 NOTE — ED Notes (Signed)
ED Provider at bedside. 

## 2017-03-13 LAB — URINE CULTURE: Culture: NO GROWTH
